# Patient Record
Sex: Male | Born: 1955 | Race: Black or African American | Hispanic: No | Marital: Married | Smoking: Never smoker
Health system: Southern US, Community
[De-identification: ages and names within clinical notes are randomized; demographics above are authoritative.]

## PROBLEM LIST (undated history)

## (undated) DIAGNOSIS — E119 Type 2 diabetes mellitus without complications: Secondary | ICD-10-CM

## (undated) DIAGNOSIS — I10 Essential (primary) hypertension: Secondary | ICD-10-CM

## (undated) DIAGNOSIS — M48061 Spinal stenosis, lumbar region without neurogenic claudication: Secondary | ICD-10-CM

## (undated) DIAGNOSIS — Z889 Allergy status to unspecified drugs, medicaments and biological substances status: Secondary | ICD-10-CM

## (undated) DIAGNOSIS — U071 COVID-19: Secondary | ICD-10-CM

## (undated) HISTORY — PX: COLONOSCOPY W/ POLYPECTOMY: SHX1380

---

## 2010-08-05 ENCOUNTER — Ambulatory Visit (HOSPITAL_COMMUNITY): Admission: RE | Admit: 2010-08-05 | Discharge: 2010-08-05 | Payer: Self-pay | Admitting: Neurosurgery

## 2011-01-03 ENCOUNTER — Emergency Department (HOSPITAL_COMMUNITY)
Admission: EM | Admit: 2011-01-03 | Discharge: 2011-01-03 | Payer: Self-pay | Source: Home / Self Care | Admitting: Emergency Medicine

## 2011-01-03 ENCOUNTER — Inpatient Hospital Stay (HOSPITAL_COMMUNITY)
Admission: AD | Admit: 2011-01-03 | Discharge: 2011-01-06 | Payer: Self-pay | Source: Home / Self Care | Attending: Internal Medicine | Admitting: Internal Medicine

## 2011-01-06 LAB — GLUCOSE, CAPILLARY
Glucose-Capillary: 441 mg/dL — ABNORMAL HIGH (ref 70–99)
Glucose-Capillary: 357 mg/dL — ABNORMAL HIGH (ref 70–99)
Glucose-Capillary: 191 mg/dL — ABNORMAL HIGH (ref 70–99)

## 2011-01-06 LAB — BLOOD GAS, ARTERIAL
Bicarbonate: 21.5 mEq/L (ref 20.0–24.0)
O2 Saturation: 94 %
TCO2: 18.7 mmol/L (ref 0–100)
pH, Arterial: 7.39 (ref 7.350–7.450)

## 2011-01-06 LAB — AMYLASE: Amylase: 46 U/L (ref 0–105)

## 2011-01-06 LAB — HEMOGLOBIN A1C: Hgb A1c MFr Bld: 12.6 % — ABNORMAL HIGH (ref <5.7)

## 2011-01-06 LAB — COMPREHENSIVE METABOLIC PANEL
ALT: 72 U/L — ABNORMAL HIGH (ref 0–53)
AST: 121 U/L — ABNORMAL HIGH (ref 0–37)
Albumin: 3.8 g/dL (ref 3.5–5.2)
BUN: 59 mg/dL — ABNORMAL HIGH (ref 6–23)
Creatinine, Ser: 2.87 mg/dL — ABNORMAL HIGH (ref 0.4–1.5)
GFR calc Af Amer: 28 mL/min — ABNORMAL LOW (ref >60)
Sodium: 126 mEq/L — ABNORMAL LOW (ref 135–145)
Total Protein: 8.2 g/dL (ref 6.0–8.3)

## 2011-01-06 LAB — DIFFERENTIAL
Eosinophils Relative: 2 % (ref 0–5)
Lymphocytes Relative: 26 % (ref 12–46)

## 2011-01-06 LAB — CBC
HCT: 45.2 % (ref 39.0–52.0)
MCV: 82.8 fL (ref 78.0–100.0)
Platelets: 233 10*3/uL (ref 150–400)
RDW: 12.8 % (ref 11.5–15.5)
WBC: 8 10*3/uL (ref 4.0–10.5)

## 2011-01-06 LAB — LIPASE, BLOOD: Lipase: 60 U/L — ABNORMAL HIGH (ref 11–59)

## 2011-01-07 LAB — GLUCOSE, CAPILLARY
Glucose-Capillary: 135 mg/dL — ABNORMAL HIGH (ref 70–99)
Glucose-Capillary: 144 mg/dL — ABNORMAL HIGH (ref 70–99)
Glucose-Capillary: 158 mg/dL — ABNORMAL HIGH (ref 70–99)
Glucose-Capillary: 210 mg/dL — ABNORMAL HIGH (ref 70–99)
Glucose-Capillary: 162 mg/dL — ABNORMAL HIGH (ref 70–99)
Glucose-Capillary: 184 mg/dL — ABNORMAL HIGH (ref 70–99)
Glucose-Capillary: 233 mg/dL — ABNORMAL HIGH (ref 70–99)
Glucose-Capillary: 255 mg/dL — ABNORMAL HIGH (ref 70–99)
Glucose-Capillary: 262 mg/dL — ABNORMAL HIGH (ref 70–99)
Glucose-Capillary: 165 mg/dL — ABNORMAL HIGH (ref 70–99)
Glucose-Capillary: 223 mg/dL — ABNORMAL HIGH (ref 70–99)

## 2011-01-07 LAB — COMPREHENSIVE METABOLIC PANEL
ALT: 75 U/L — ABNORMAL HIGH (ref 0–53)
ALT: 103 U/L — ABNORMAL HIGH (ref 0–53)
AST: 188 U/L — ABNORMAL HIGH (ref 0–37)
Albumin: 3.1 g/dL — ABNORMAL LOW (ref 3.5–5.2)
BUN: 64 mg/dL — ABNORMAL HIGH (ref 6–23)
CO2: 26 mEq/L (ref 19–32)
Calcium: 8.8 mg/dL (ref 8.4–10.5)
Calcium: 9 mg/dL (ref 8.4–10.5)
Chloride: 102 mEq/L (ref 96–112)
Creatinine, Ser: 2.66 mg/dL — ABNORMAL HIGH (ref 0.4–1.5)
Creatinine, Ser: 1.6 mg/dL — ABNORMAL HIGH (ref 0.4–1.5)
GFR calc Af Amer: 55 mL/min — ABNORMAL LOW (ref >60)
GFR calc non Af Amer: 25 mL/min — ABNORMAL LOW (ref >60)
Glucose, Bld: 137 mg/dL — ABNORMAL HIGH (ref 70–99)
Glucose, Bld: 159 mg/dL — ABNORMAL HIGH (ref 70–99)
Potassium: 3.8 mEq/L (ref 3.5–5.1)
Sodium: 144 mEq/L (ref 135–145)
Total Protein: 6.5 g/dL (ref 6.0–8.3)
Total Protein: 6.8 g/dL (ref 6.0–8.3)

## 2011-01-07 LAB — HEPATITIS A ANTIBODY, IGM: Hep A IgM: NEGATIVE

## 2011-01-07 LAB — LIPID PANEL
HDL: 25 mg/dL — ABNORMAL LOW (ref >39)
LDL Cholesterol: 54 mg/dL (ref 0–99)
Triglycerides: 365 mg/dL — ABNORMAL HIGH (ref <150)

## 2011-01-07 LAB — BASIC METABOLIC PANEL
BUN: 20 mg/dL (ref 6–23)
CO2: 26 mEq/L (ref 19–32)
Chloride: 111 mEq/L (ref 96–112)
Glucose, Bld: 176 mg/dL — ABNORMAL HIGH (ref 70–99)
Potassium: 3.4 mEq/L — ABNORMAL LOW (ref 3.5–5.1)
Sodium: 142 mEq/L (ref 135–145)

## 2011-01-07 LAB — HEPATITIS B SURFACE ANTIGEN: Hepatitis B Surface Ag: NEGATIVE

## 2011-01-07 LAB — LIPASE, BLOOD: Lipase: 41 U/L (ref 11–59)

## 2011-01-13 NOTE — H&P (Addendum)
NAMEKENTAVIOUS, Meyer NO.:  [id number]  MEDICAL RECORD NO.:  [id number]          PATIENT TYPE:  INP  LOCATION:  1301                         FACILITY:  [facility]  PHYSICIAN:  Isidor Holts, M.D.  DATE OF BIRTH:  28-Mar-1956  DATE OF ADMISSION:  01/03/2011 DATE OF DISCHARGE:                             HISTORY & PHYSICAL   PRIMARY CARE PROVIDER:  Emeterio Reeve, M.D.  The patient is being admitted to [facility].  CHIEF COMPLAINT:  High glucose.  HISTORY OF PRESENT ILLNESS:  The patient states that he was started on metformin and Glucotrol 2 days ago for a hemoglobin A1c that went from 6 to 12 over the last 3 months.  He indicates that over the last several weeks, he was experiencing increased thirst and polyuria.  When his hemoglobin A1c was found to be 12, he was started on medication.  He indicates that during the last 2 days of taking them med, has noticed a slight improvement with the polyuria and the thirst but it is not completely resolved.  He went to his primary care provider for followup lab work yesterday.  He was called today and asked to come back to the office as the blood work from yesterday yielded a glucose of greater than 700.  He went to his primary care provider's office today, and his glucose was 488 at the office.  The patient denies any headache but does endorse dizziness with position changes.  He denies chest pain, palpitation, nausea, or vomiting.  In addition, he also reports having developed intermittent abdominal pain over the last 3 weeks.  He states the pain is located in the epigastric area and radiates to the left upper quadrant.  He indicates that the pain is a dull aching type of pain and occurs only after eating.  He denies any nausea, vomiting, diarrhea, or constipation.  He does indicate that he has lost 20 pounds over the last several weeks as he has cut back on his eating secondary to the pain.   He also reports that last year he was diagnosed with H. pylori and was treated and it is his belief he completed treatment to the point of resolution, but he does indicate that the pain that he is experiencing now is similar to the pain he experienced during that time. He indicates symptoms came on gradually, have persisted and worsened. Symptoms are characterized as moderate.  Workup in the ED yielded CBG of 539.  We are asked to admit for further evaluation and treatment from his primary care provider.  ALLERGIES:  PREVPAC.  PAST MEDICAL HISTORY: 1. Hypertension. 2. Gout. 3. H. pylori. 4. Elevated triglycerides.  PAST SURGICAL HISTORY:  None.  FAMILY MEDICAL HISTORY:  Father deceased at age 100 from an MI, also had diabetes and hypertension.  Mother deceased at age 60, she had diabetes, hypertension, and renal failure.  The patient has 5 siblings and their collective medical history is positive for diabetes and hypertension. Negative for stroke or heart disease.  The patient is married.  He is a Corporate treasurer.  He denies  tobacco use.  Denies EtOH use.  MEDICATIONS: 1. Glucotrol XL 5 mg p.o. daily. 2. Prilosec 40 mg p.o. daily. 3. Metformin 1000 mg half a tab p.o. b.i.d. 4. Atenolol chlorthalidone 25/100 one tablet p.o. daily. 5. Allopurinol 300 mg p.o. daily. 6. Diovan 320 mg p.o. daily. 7. Zyrtec 10 mg p.o. daily.  REVIEW OF SYSTEMS:  GENERAL:  Negative for fever, chills, or anorexia. Positive for unintentional weight loss. ENT:  Negative for ear pain, nasal congestion, or sore throat. CV:  Negative for chest pain or palpitations.  Positive for trace lower extremity edema. RESPIRATORY:  Negative for increased work of breathing or cough. MUSCULOSKELETAL:  Negative for joint pain or muscle weakness. NEURO:  Positive for some headache.  Negative for visual disturbances, numbness or tingling of extremities. GI:  See HPI. GU:  Positive for polyuria.  Negative for  dysuria or hematuria. PSYCH:  Negative for depression or anxiety. HEME:  Negative for any unusual bruising or bleeding.  LABORATORY DATA:  WBC is 8.0, hemoglobin 16.5, hematocrit 45.2, and platelets 233,000.  Lipase, complete metabolic panel, amylase, and ABG are all pending.  RADIOLOGY:  Chest x-ray is pending.  PHYSICAL EXAMINATION:  VITAL SIGNS:  T 98.2, blood pressure 96/67, heart rate 73, and respiration 18. GENERAL:  Awake, alert, well-nourished, well-hydrated, and no acute distress. HEAD:  Normocephalic, atraumatic.  Pupils equal, round, and reactive to light.  EOMI.  Mucous membranes of his mouth are dry but pink.  No obvious lesion or exudate in his nose or ears. NECK:  Supple.  No JVD.  Full range of motion.  No lymphadenopathy. CV:  Regular rate and rhythm.  No murmur, gallop, or rub.  Trace lower extremity edema.  Pedal pulses present and palpable. RESPIRATORY:  No increased work of breathing.  Breath sounds clear to auscultation bilaterally.  No rhonchi, wheezes, or rales. ABDOMEN:  Obese and soft, very sluggish bowel sounds.  Mild tenderness to palpation in epigastric area.  No mass or organomegaly noted. NEURO:  Alert and oriented x3.  Cranial nerves II-XII grossly intact. MUSCULOSKELETAL:  Moves all extremities.  No joint pain/erythema.  Full range of motion. EXTREMITIES:  Without clubbing or cyanosis.  ASSESSMENT AND PLAN: 1. Severe uncontrolled diabetes type 2.  We will admit to medical     floor.  We will get a complete blood count, comprehensive metabolic     panel, and arterial blood gases.  We will give NovoLog 20 units now     as capillary blood gas in triage was 539.  We will also use sliding     scale glycemic control while awaiting test results.  We will give     intravenous fluids at 250 mL an hour.  If diabetic ketoacidosis, we     will start the glucose stabilizer. 2. Abdominal pain.  We will continue his proton-pump inhibitors.  We     will check  his amylase and his lipase. 3. Hypertension.  Blood pressure is currently 100/70.  We will hold     any antihypertensives for now.  We will monitor closely. 4. Gout.  Currently at baseline.  We will continue his home meds. 5. Gastroesophageal reflux disease, currently at baseline.  Continue     proton-pump inhibitors. 6. Deep venous thrombosis prophylaxis, we will use Lovenox.  CODE STATUS:  The patient is a full code.  This assessment and plan was discussed with Dr. Brien Few.     Gwenyth Bender, NP   ______________________________ Isidor Holts, M.D.  KMB/MEDQ  D:  01/03/2011  T:  01/03/2011  Job:  [id number]  cc:   Emeterio Reeve, MD Fax: [phone]  Electronically Signed by Isidor Holts M.D. on 01/13/2011 04:16:31 PM Electronically Signed by Toya Smothers  on 01/14/2011 12:42:18 PM

## 2011-01-13 NOTE — Discharge Summary (Signed)
NAMEBEAU, RAMSBURG NO.:  [id number]  MEDICAL RECORD NO.:  [id number]          PATIENT TYPE:  INP  LOCATION:  1301                         FACILITY:  [facility]  PHYSICIAN:  Isidor Holts, M.D.  DATE OF BIRTH:  [birth year]  DATE OF ADMISSION:  01/03/2011 DATE OF DISCHARGE:  01/06/2011                              DISCHARGE SUMMARY   PRIMARY MD:  Emeterio Reeve, MD, [facility] at [place].  DISCHARGE DIAGNOSES: 1. Newly-diagnosed uncontrolled diabetes mellitus. 2. Dehydration/acute renal failure, secondary to newly-diagnosed     uncontrolled diabetes mellitus as well as diuretic treatment. 3. Hypertension. 4. Gout. 5. Gastroesophageal reflux disease. 6. Fatty liver, new diagnosis. 7. Hypertriglyceridemia.  DISCHARGE MEDICATIONS: 1. Fenofibrate 84 mg p.o. daily. 2. NovoLog insulin per sliding scale as follows.  For CBG 70-120, no     insulin; CBG 121-150, 3 units; CBG 151-200, 4 units; CBG 201-250, 7     units; CBG 251-300, 11 units; CBG 301-350, 15 units; CBG 351-400 20     units. 3. Lantus insulin 20 units subcutaneously b.i.d. 4. Allopurinol 300 mg p.o. daily. 5. Prilosec 40 mg p.o. daily. 6. Zyrtec 10 mg p.o. daily.  Note:  Atenolol/chlorthalidone and Diovan have been discontinued, secondary to hypotension and dehydration/acute renal failure, until reevaluated by primary MD.  Also, metformin and glipizide are on hold until reevaluated by primary MD.  PROCEDURES: 1. Chest x-ray on January 03, 2011.  This showed shallow inflation.     No evidence of acute cardiopulmonary abnormality. 2. Abdominal ultrasound scan on January 05, 2011.  This showed dense     liver increased echogenicity consistent with hepatic steatosis.  No     acute finding otherwise by abdominal ultrasound.  CONSULTATIONS:  None.  ADMISSION HISTORY:  As in H&P notes of December 24, 2010, dictated by Toya Smothers, PA for this MD.  However in brief, this is a 55 year old male, with  known history of hypertension, gout, previous H pylori infection, hypertriglyceridemia, presenting with increased thirst and polyuria over the past several weeks.  Reportedly, he was started on metformin and Glucotrol 2 days prior to this presentation for hemoglobin A1c that went from 6-12 over the last 3 months.  He subsequently returned to his primary care provider for followup lab work on January 02, 2011 and he then was called back to the office on January 03, 2011, as his blood work report had indicated a glucose of greater than 700 in his primary care provider's office on January 03, 2011, CBG was markedly elevated.  He was therefore referred to the Emergency Department and was subsequently admitted to the hospitalist service for further evaluation, investigation, and management.  CLINICAL COURSE: 1. Severe uncontrolled type 2 diabetes mellitus.  The patient's blood     glucose at the time of initial evaluation, was 539.  He however,     had no evidence of either hyperosmolality or ketoacidosis.  He was     therefore started on aggressive intravenous fluid hydration and     intravenous glucose infusion via glucose stabilizer protocol.     Clinical response was satisfactory, and by January 04, 2011, we     were able to transition him to scheduled Lantus insulin and     carbohydrate modified diet.  Thereafter, titration of Lantus was     carried out, as well as well as sliding-scale insulin coverage.  By     January 06, 2011, his CBGs were ranging between 176 and 223.     Further titration will course be required, which we shall defer to     his primary MD.  2. Dehydration/acute renal failure.  The patient at the time of     presentation, was found to have a BUN which was markedly elevated at     59 with a creatinine of 2.87, consistent with dehydration/acute     renal failure.  He was also hypotensive with SBP ranging between     the 70s and 80s.  He was managed with aggressive  intravenous saline     hydration, with recovery of blood pressure and normalization of     renal function.  Renal ultrasound scan was unremarkable for any     renal abnormalities; and as of January 06, 2011, BUN was 20,     creatinine 1.25, which we suspect is his baseline, although further     improvement may indeed occur.  BP as of January 06, 2011, was also     normal at 122.  3. Hypertension.  As described above, the patient presented with     volume depletion hypotension and acute renal failure.  His     antihypertensive medications were therefore, placed on hold.  This     has not been reinstated; and as of January 06, 2011, his BP was     122/81.  We shall therefore defer decision as to reinstatement     of antihypertensive medications, to his primary MD on followup.  4. History of gout.  This did not prove problematic during the course     of the patient's hospitalization.  He continues on allopurinol.  5. GERD.  He was asymptomatic on proton-pump inhibitor.  6. Fatty liver.  The patient was found to have transaminitis at the     time of his initial evaluation with total bilirubin of 1.1,     alkaline phosphatase 189, AST 121, ALT 72.  The patient is a     nondrinker.  Hepatitis A, B, C serology were all negative.  Liver     ultrasound scan showed diffuse increased echogenicity consistent     with fatty liver disease.  He has been encouraged to lose weight.  7. Hypertriglyceridemia.  The patient's lipid profile showed the     following findings:  Total cholesterol 152, triglyceride 365, HDL     25, LDL 54.  He has been commenced on fenofibrate, accordingly.  DISPOSITION:  The patient was on January 06, 2011, asymptomatic.  There were no new issues.  He was considered clinically stable for discharge, and was therefore discharged accordingly.  ACTIVITY:  As tolerated.  Recommended to increase activity slowly.  DIET:  Heart-healthy/carbohydrate modified.  FOLLOWUP  INSTRUCTIONS:  The patient is to follow up with his primary MD, Dr. Johnn Hai, within a week.  An appointment has already been scheduled for January 15, 2011 on 9:45 a.m.  SPECIAL INSTRUCTIONS:  The patient is recommended to return to regular duties on January 16, 2011.     Isidor Holts, M.D.     CO/MEDQ  D:  01/06/2011  T:  01/06/2011  Job:  [id number]  cc:   Emeterio Reeve, MD Fax: [phone]  Electronically Signed by Isidor Holts M.D. on 01/13/2011 04:16:00 PM

## 2013-03-13 ENCOUNTER — Emergency Department (HOSPITAL_COMMUNITY)
Admission: EM | Admit: 2013-03-13 | Discharge: 2013-03-13 | Disposition: A | Discharge status: Home / Self Care | Payer: BC Managed Care – PPO | Attending: Emergency Medicine | Admitting: Emergency Medicine

## 2013-03-13 ENCOUNTER — Encounter (HOSPITAL_COMMUNITY): Payer: Self-pay

## 2013-03-13 DIAGNOSIS — Z794 Long term (current) use of insulin: Secondary | ICD-10-CM | POA: Insufficient documentation

## 2013-03-13 DIAGNOSIS — M5412 Radiculopathy, cervical region: Secondary | ICD-10-CM | POA: Insufficient documentation

## 2013-03-13 DIAGNOSIS — Z79899 Other long term (current) drug therapy: Secondary | ICD-10-CM | POA: Insufficient documentation

## 2013-03-13 DIAGNOSIS — M25559 Pain in unspecified hip: Secondary | ICD-10-CM | POA: Insufficient documentation

## 2013-03-13 DIAGNOSIS — I10 Essential (primary) hypertension: Secondary | ICD-10-CM | POA: Insufficient documentation

## 2013-03-13 DIAGNOSIS — M5416 Radiculopathy, lumbar region: Secondary | ICD-10-CM

## 2013-03-13 DIAGNOSIS — E119 Type 2 diabetes mellitus without complications: Secondary | ICD-10-CM | POA: Insufficient documentation

## 2013-03-13 DIAGNOSIS — Z7982 Long term (current) use of aspirin: Secondary | ICD-10-CM | POA: Insufficient documentation

## 2013-03-13 DIAGNOSIS — M7918 Myalgia, other site: Secondary | ICD-10-CM

## 2013-03-13 HISTORY — DX: Type 2 diabetes mellitus without complications: E11.9

## 2013-03-13 HISTORY — DX: Essential (primary) hypertension: I10

## 2013-03-13 LAB — POCT I-STAT, CHEM 8
BUN: 20 mg/dL (ref 6–23)
Calcium, Ion: 1.19 mmol/L (ref 1.12–1.23)
Chloride: 102 mEq/L (ref 96–112)
Glucose, Bld: 238 mg/dL — ABNORMAL HIGH (ref 70–99)
HCT: 48 % (ref 39.0–52.0)
TCO2: 26 mmol/L (ref 0–100)

## 2013-03-13 MED ORDER — IBUPROFEN 200 MG PO TABS
600.0000 mg | ORAL_TABLET | Freq: Once | ORAL | Status: AC
  Administered 2013-03-13: 600 mg via ORAL
  Filled 2013-03-13: qty 3

## 2013-03-13 MED ORDER — HYDROCODONE-ACETAMINOPHEN 5-325 MG PO TABS
2.0000 | ORAL_TABLET | Freq: Once | ORAL | Status: AC
  Administered 2013-03-13: 2 via ORAL
  Filled 2013-03-13: qty 2

## 2013-03-13 MED ORDER — CYCLOBENZAPRINE HCL 5 MG PO TABS: 5.0000 mg | ORAL_TABLET | Freq: Three times a day (TID) | ORAL | Status: DC | PRN

## 2013-03-13 MED ORDER — HYDROCODONE-ACETAMINOPHEN 5-325 MG PO TABS: 1.0000 | ORAL_TABLET | Freq: Four times a day (QID) | ORAL | Status: DC | PRN

## 2013-03-13 MED ORDER — CYCLOBENZAPRINE HCL 10 MG PO TABS
5.0000 mg | ORAL_TABLET | Freq: Once | ORAL | Status: AC
  Administered 2013-03-13: 5 mg via ORAL
  Filled 2013-03-13: qty 1

## 2013-03-13 NOTE — ED Notes (Signed)
Dr. Jani Files made aware of 138/101 B/P; pt states he will go home and take his BP medication

## 2013-03-13 NOTE — ED Notes (Signed)
Pt states that he has had severe hip and leg pain to left since yesterday.  Is to see MD on April 7th for same but pain has worsened.  Pt states pain radiated down leg.  No recent trauma or injury known.

## 2013-03-13 NOTE — ED Provider Notes (Signed)
History     CSN: [id number]  Arrival date & time 03/13/13  0625   First MD Initiated Contact with Patient 03/13/13 [phone]      Chief Complaint  Patient presents with  . Hip Pain    (Consider location/radiation/quality/duration/timing/severity/associated sxs/prior treatment) HPI [name] is a 57 y.o. male presenting with left buttocks pain it radiates into the left calf. This is a long-standing problem, he had a MRI of his back in 2011 which showed some stenosis likely responsible for this radicular pain.  He has been referred to neurosurgery with Dr. Mikal Plane by his primary care physician and said "I can't wait because of the pain." Patient said he was given one Vicodin which seemed to do the trick the other day, his pain has been severe it starts in the left buttock and is sharp, radiates he feels it in his buttock and in the calf but not in the posterior thigh.  He's had no associated urinary or fecal incontinence, urinary retention, subtle anesthesia. He's had no weakness. His pain is worse on walking, and in certain positions. Denies any IV drug use, he does not smoke and does not drink alcohol. He has not really taken anything for it besides the Vicodin he got from a friend.   Past Medical History  Diagnosis Date  . Hypertension   . Diabetes mellitus without complication     History reviewed. No pertinent past surgical history.  History reviewed. No pertinent family history.  History  Substance Use Topics  . Smoking status: Never Smoker   . Smokeless tobacco: Not on file  . Alcohol Use: No      Review of Systems At least 10pt or greater review of systems completed and are negative except where specified in the HPI.  Allergies  Other  Home Medications   Current Outpatient Rx  Name  Route  Sig  Dispense  Refill  . allopurinol (ZYLOPRIM) 100 MG tablet   Oral   Take 100 mg by mouth daily.         Marland Kitchen aspirin EC 81 MG tablet   Oral   Take 81 mg by mouth  daily.         Marland Kitchen atenolol-chlorthalidone (TENORETIC) 100-25 MG per tablet   Oral   Take 1 tablet by mouth daily.         . cetirizine (ZYRTEC) 10 MG tablet   Oral   Take 10 mg by mouth daily.         Marland Kitchen CHERRY CONCENTRATE PO   Oral   Take 2 capsules by mouth daily.         . insulin aspart (NOVOLOG) 100 UNIT/ML injection   Subcutaneous   Inject 0-4 Units into the skin 3 (three) times daily before meals. Per sliding scale         . insulin detemir (LEVEMIR) 100 UNIT/ML injection   Subcutaneous   Inject 6 Units into the skin daily.         Marland Kitchen omega-3 acid ethyl esters (LOVAZA) 1 G capsule   Oral   Take 2 g by mouth daily.         . pravastatin (PRAVACHOL) 40 MG tablet   Oral   Take 40 mg by mouth daily.         . cyclobenzaprine (FLEXERIL) 5 MG tablet   Oral   Take 1 tablet (5 mg total) by mouth 3 (three) times daily as needed for muscle spasms.   30  tablet   0   . HYDROcodone-acetaminophen (NORCO/VICODIN) 5-325 MG per tablet   Oral   Take 1-2 tablets by mouth every 6 (six) hours as needed for pain.   17 tablet   0     BP 154/88  Pulse 80  Temp(Src) 98.7 F (37.1 C) (Oral)  Resp 20  SpO2 96%  Physical Exam  Musculoskeletal:       Back:    Nursing notes reviewed.  Electronic medical record reviewed. VITAL SIGNS:   Filed Vitals:   03/13/13 0630  BP: 154/88  Pulse: 80  Temp: 98.7 F (37.1 C)  TempSrc: Oral  Resp: 20  SpO2: 96%   CONSTITUTIONAL: Awake, oriented, appears non-toxic HENT: Atraumatic, normocephalic, oral mucosa pink and moist, airway patent. Nares patent without drainage. External ears normal. EYES: Conjunctiva clear, EOMI, PERRLA NECK: Trachea midline, non-tender, supple CARDIOVASCULAR: Normal heart rate, Normal rhythm, No murmurs, rubs, gallops PULMONARY/CHEST: Clear to auscultation, no rhonchi, wheezes, or rales. Symmetrical breath sounds. Non-tender. ABDOMINAL: Non-distended, soft, non-tender - no rebound or guarding.   BS normal. NEUROLOGIC: Non-focal, moving all four extremities, no gross sensory or motor deficits. BACK: No step-offs or deformities, nontender to palpation in the midline, no skin abnormalities. Left buttock is tender to palpation see pictorial EXTREMITIES: No clubbing, cyanosis, or edema SKIN: Warm, Dry, No erythema, No rash  ED Course  Procedures (including critical care time)  Labs Reviewed  POCT I-STAT, CHEM 8 - Abnormal; Notable for the following:    Glucose, Bld 238 (*)    All other components within normal limits   No results found.   1. Left buttock pain   2. Pain, radicular, lumbar       MDM  [name] is a 57 y.o. male with diabetes presenting with low back pain. On back pain has been chronic since 2011, on review of prior records now I think this is likely acute on chronic back pain. Patient has appropriate followup with neurosurgery no new symptoms consistent with epidural abscess, osteomyelitis, cauda equina syndrome or cord compression syndrome. Check an i-STAT Was found patient creatinine normal at 1.0, we'll put him on 2-3 days of ibuprofen as well as some Norco and Flexeril. Patient to followup with neurosurgery as planned.    I explained the diagnosis and have given explicit precautions to return to the ER including saddle anesthesia, urinary retention, urinary or fecal incontinence or any other new or worsening symptoms. The patient understands and accepts the medical plan as it's been dictated and I have answered their questions. Discharge instructions concerning home care and prescriptions have been given.  The patient is STABLE and is discharged to home in good condition.             Jones Skene, MD 03/13/13 [phone]

## 2013-03-15 ENCOUNTER — Other Ambulatory Visit: Payer: Self-pay | Admitting: Family Medicine

## 2013-03-15 DIAGNOSIS — M5410 Radiculopathy, site unspecified: Secondary | ICD-10-CM

## 2013-03-15 DIAGNOSIS — M545 Low back pain: Secondary | ICD-10-CM

## 2013-03-17 ENCOUNTER — Other Ambulatory Visit: Payer: BC Managed Care – PPO

## 2013-03-25 ENCOUNTER — Encounter (HOSPITAL_COMMUNITY): Payer: Self-pay | Admitting: Pharmacy Technician

## 2013-03-25 NOTE — Progress Notes (Signed)
Need orders in EPIC.  Surgery scheduled for 04/06/13.  Preop appointment will be on 03/28/13 at 0800am.  Thanks.

## 2013-03-25 NOTE — Patient Instructions (Addendum)
[name]  03/25/2013   Your procedure is scheduled on:  04/06/13 [facility]  Report to [facility] at   0530    AM.  Call this number if you have problems the morning of surgery: [phone]       Remember: TAKE ONE HALF DOSAGE INSULIN Tuesday NIGHT    IF YOU NEED TO TAKE IT by previous instructions from Medical MD  Do not eat food  Or drink :After Midnight. Tuesday NIGHT   Take these medicines the morning of surgery with A SIP OF WATER:NEURONTIN, Zyrtec                           MAY TAKE PERCOCET IF NEEDED DO NOT TAKE ANY INSULIN Wednesday MORNING  .  Contacts, dentures or partial plates can not be worn to surgery  Leave suitcase in the car. After surgery it may be brought to your room.  For patients admitted to the hospital, checkout time is 11:00 AM day of  discharge.             SPECIAL INSTRUCTIONS- SEE [facility] PREPARING FOR SURGERY INSTRUCTION SHEET-     DO NOT WEAR JEWELRY, LOTIONS, POWDERS, OR PERFUMES.  WOMEN-- DO NOT SHAVE LEGS OR UNDERARMS FOR 12 HOURS BEFORE SHOWERS. MEN MAY SHAVE FACE.  Patients discharged the day of surgery will not be allowed to drive home. IF going home the day of surgery, you must have a driver and someone to stay with you for the first 24 hours  Name and phone number of your driver:    Angelique Blonder   wife                                                                    Please read over the following fact sheets that you were given: MRSA Information, Incentive Spirometry Sheet, Blood Transfusion Sheet  Information                                                                                   [name]  PST 336  [id number]                 FAILURE TO FOLLOW THESE INSTRUCTIONS MAY RESULT IN  CANCELLATION   OF YOUR SURGERY                                                  Patient Signature _____________________________

## 2013-03-27 ENCOUNTER — Other Ambulatory Visit: Payer: Self-pay | Admitting: Orthopedic Surgery

## 2013-03-28 ENCOUNTER — Other Ambulatory Visit: Payer: Self-pay | Admitting: Orthopedic Surgery

## 2013-03-28 ENCOUNTER — Encounter (HOSPITAL_COMMUNITY): Payer: Self-pay

## 2013-03-28 ENCOUNTER — Encounter (HOSPITAL_COMMUNITY)
Admission: RE | Admit: 2013-03-28 | Discharge: 2013-03-28 | Disposition: A | Discharge status: Home / Self Care | Payer: BC Managed Care – PPO | Source: Ambulatory Visit | Attending: Specialist | Admitting: Specialist

## 2013-03-28 ENCOUNTER — Ambulatory Visit (HOSPITAL_COMMUNITY)
Admission: RE | Admit: 2013-03-28 | Discharge: 2013-03-28 | Disposition: A | Discharge status: Home / Self Care | Payer: BC Managed Care – PPO | Source: Ambulatory Visit | Attending: Orthopedic Surgery | Admitting: Orthopedic Surgery

## 2013-03-28 DIAGNOSIS — Z01810 Encounter for preprocedural cardiovascular examination: Secondary | ICD-10-CM | POA: Insufficient documentation

## 2013-03-28 DIAGNOSIS — E119 Type 2 diabetes mellitus without complications: Secondary | ICD-10-CM | POA: Insufficient documentation

## 2013-03-28 DIAGNOSIS — M48061 Spinal stenosis, lumbar region without neurogenic claudication: Secondary | ICD-10-CM | POA: Insufficient documentation

## 2013-03-28 DIAGNOSIS — Z01812 Encounter for preprocedural laboratory examination: Secondary | ICD-10-CM | POA: Insufficient documentation

## 2013-03-28 DIAGNOSIS — R209 Unspecified disturbances of skin sensation: Secondary | ICD-10-CM | POA: Insufficient documentation

## 2013-03-28 DIAGNOSIS — I10 Essential (primary) hypertension: Secondary | ICD-10-CM | POA: Insufficient documentation

## 2013-03-28 DIAGNOSIS — M5126 Other intervertebral disc displacement, lumbar region: Secondary | ICD-10-CM | POA: Insufficient documentation

## 2013-03-28 DIAGNOSIS — J984 Other disorders of lung: Secondary | ICD-10-CM | POA: Insufficient documentation

## 2013-03-28 HISTORY — DX: Allergy status to unspecified drugs, medicaments and biological substances: Z88.9

## 2013-03-28 HISTORY — DX: Spinal stenosis, lumbar region without neurogenic claudication: M48.061

## 2013-03-28 LAB — BASIC METABOLIC PANEL
BUN: 18 mg/dL (ref 6–23)
Calcium: 9.8 mg/dL (ref 8.4–10.5)
Creatinine, Ser: 1.11 mg/dL (ref 0.50–1.35)
GFR calc Af Amer: 85 mL/min — ABNORMAL LOW (ref >90)

## 2013-03-28 LAB — CBC
MCHC: 35.1 g/dL (ref 30.0–36.0)
MCV: 84.4 fL (ref 78.0–100.0)
Platelets: 256 10*3/uL (ref 150–400)
RDW: 12.2 % (ref 11.5–15.5)
WBC: 5.6 10*3/uL (ref 4.0–10.5)

## 2013-03-28 LAB — SURGICAL PCR SCREEN: Staphylococcus aureus: NEGATIVE

## 2013-03-28 NOTE — Progress Notes (Signed)
Patient denies any cardiac problems other than hyperrtension.  After preliminary EKG completed here, I Called Dr Paulino Rily office for old EKG record

## 2013-03-28 NOTE — Progress Notes (Signed)
03/28/13 1218  OBSTRUCTIVE SLEEP APNEA  Have you ever been diagnosed with sleep apnea through a sleep study? No  Do you snore loudly (loud enough to be heard through closed doors)?  0  Do you often feel tired, fatigued, or sleepy during the daytime? 0  Has anyone observed you stop breathing during your sleep? 0  Do you have, or are you being treated for high blood pressure? 1  BMI more than 35 kg/m2? 1  Age over 55 years old? 1  Neck circumference greater than 40 cm/18 inches? 1  Gender: 1  Obstructive Sleep Apnea Score 5  Score 4 or greater  Results sent to PCP

## 2013-03-30 NOTE — Progress Notes (Signed)
EKG reviewed per Dr Acey Lav with history review- OK

## 2013-04-05 ENCOUNTER — Other Ambulatory Visit: Payer: Self-pay | Admitting: Orthopedic Surgery

## 2013-04-05 NOTE — H&P (Signed)
[name] is an 57 y.o. male.   Chief Complaint: back and left leg pain HPI: 56y/o male c/o back and L buttock/leg pain, numbness, weakness x 1.5 months without injury. The pain radiates to the left buttock, left lateral thigh, left lateral lower leg and left foot. The patient describes the pain as sharp, aching and tingling. Symptoms are exacerbated by standing (and walking). Symptoms are relieved by rest and recumbency. Pain refractory to nonsteroidal anti-inflammatory drugs, non-opioid analgesics, opioid analgesics, muscle relaxants and activity modification. 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 He has started using a walker for ambulation. Denies bowel/bladder incontinence. Does note weakness at the knee. He was sent to 1 visit of PT with no relief with McKenzie methods. Symptoms refractory to conservative tx as listed above. Past Medical History  Diagnosis Date  . Hypertension   . Diabetes mellitus without complication   . Spinal stenosis of lumbar region   . H/O seasonal allergies     Past Surgical History  Procedure Laterality Date  . Colonoscopy w/ polypectomy      No family history on file. Social History:  reports that he has never smoked. He does not have any smokeless tobacco history on file. He reports that he does not drink alcohol or use illicit drugs.  Allergies:  Allergies  Allergen Reactions  . Other Other (See Comments)    Prevpack:made his tongue swell     (Not in a hospital admission)  No results found for this or any previous visit (from the past 48 hour(s)). No results found.  Review of Systems  Constitutional: Negative.   HENT: Negative.   Eyes: Negative.   Respiratory: Negative.   Cardiovascular: Negative.   Gastrointestinal: Negative.   Genitourinary: Negative.   Musculoskeletal: Positive for back pain.  Skin: Negative.   Neurological:  Positive for sensory change and focal weakness.  Endo/Heme/Allergies: Negative.   Psychiatric/Behavioral: Negative.     There were no vitals taken for this visit. Physical Exam  Constitutional: He is oriented to person, place, and time. He appears well-developed and well-nourished. He appears distressed.  HENT:  Head: Normocephalic.  Eyes: Pupils are equal, round, and reactive to light.  Neck: Normal range of motion. Neck supple.  Cardiovascular: Normal rate and regular rhythm.   Respiratory: Effort normal and breath sounds normal.  GI: Soft. Bowel sounds are normal.  Musculoskeletal:  On exam, he's in severe distress. He walks with an antalgic gait, using a walker. SLR produces buttock, thigh and calf pain on the left, negative on the right. He has EHL weakness noted at 4+/5 and altered sensation in the L5 dermatome. Contralateral SLR produces left buttock pain. He has limited extension.  Lumbar spine exam reveals no evidence of soft tissue swelling, ecchymosis or deformity. The abdomen is soft and nontender. Nontender over the trochanters. No cellulitis or lymphadenopathy.  Good range of motion of the lumbar spine without associated pain. Motor is 5/5 including tibialis anterior, plantar flexion, quadriceps and hamstrings. Patient is normoreflexic. There is no Babinski or clonus. The patient has good distal pulses. No DVT. No pain and normal range of motion without instability of the hips, knees and ankles.   Neurological: He is alert and oriented to person, place, and time.  Skin: Skin is warm and dry.  Psychiatric: He has a normal mood and affect.    Three view radiographs of the lumbar spine, AP, lateral,  flexion/extension demonstrate congenitally short pedicles, no instability in flexion or extension. He does have disc degeneration at 4-5 and mild modic changes at 5-1. There is a 9 mm. lesion on the posterior iliac crest bone, and they have recommended patient to return  for a T1 axial sequence to evaluate the presence of fat within the lesion.  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 Disc herniation at 4-5 and stenosis compressing the 5 root. L5 radiculopathy, myotomal weakness, dermatomal dysesthesias despite rest, activity modification and analgesics. 2. Incidental lesion in the iliac crest needing further evaluation.  I had an extensive discussion with [name] concerning pathology, relevant anatomy and treatment options with him and his wife. He's agreed to exclude a therapeutic epidural, given the neurologic deficit, the compression, as well as his underlying insulin-dependent diabetes with a risk of elevated glucose and infection. His A1C recently has not been optimally controlled.He did indicate he just wants it "fixed". He is near 5 weeks without change and using a walker. I'd like to obtain preoperative clearance and schedule this on an urgent basis.  Concerning his medications, I've asked him to increase his Neurontin which is scheduled as well as Percocet and analgesic. We will have him scheduled for that T1 axial image of the lesion in the posterior iliac crest. We will kindly ask Dr. Paulino Rily' help in terms of the preoperative clearance. I appreciate the kind referral.  Plan for microlumbar decompression L4-5. Dr. Shelle Iron has previously discussed the procedure itself as well as risks, complications, and alternatives including but not limited to DVT, PE, infx, bleeding, failure of procedure, need for secondary procedure, anesthesia risk, even death. Pt desires to proceed.  [name] M. for Dr. Shelle Iron 04/05/2013, 2:02 PM

## 2013-04-06 ENCOUNTER — Ambulatory Visit (HOSPITAL_COMMUNITY): Payer: BC Managed Care – PPO

## 2013-04-06 ENCOUNTER — Ambulatory Visit (HOSPITAL_COMMUNITY)
Admission: RE | Admit: 2013-04-06 | Discharge: 2013-04-07 | Disposition: A | Discharge status: Home / Self Care | Payer: BC Managed Care – PPO | Source: Ambulatory Visit | Attending: Specialist | Admitting: Specialist

## 2013-04-06 ENCOUNTER — Encounter (HOSPITAL_COMMUNITY)
Admission: RE | Disposition: A | Discharge status: Home / Self Care | Payer: Self-pay | Source: Ambulatory Visit | Attending: Specialist

## 2013-04-06 ENCOUNTER — Encounter (HOSPITAL_COMMUNITY): Payer: Self-pay | Admitting: Anesthesiology

## 2013-04-06 ENCOUNTER — Ambulatory Visit (HOSPITAL_COMMUNITY): Payer: BC Managed Care – PPO | Admitting: Anesthesiology

## 2013-04-06 ENCOUNTER — Encounter (HOSPITAL_COMMUNITY): Payer: Self-pay | Admitting: *Deleted

## 2013-04-06 DIAGNOSIS — M899 Disorder of bone, unspecified: Secondary | ICD-10-CM | POA: Insufficient documentation

## 2013-04-06 DIAGNOSIS — M48062 Spinal stenosis, lumbar region with neurogenic claudication: Secondary | ICD-10-CM

## 2013-04-06 DIAGNOSIS — M5126 Other intervertebral disc displacement, lumbar region: Secondary | ICD-10-CM | POA: Insufficient documentation

## 2013-04-06 DIAGNOSIS — M949 Disorder of cartilage, unspecified: Secondary | ICD-10-CM | POA: Insufficient documentation

## 2013-04-06 DIAGNOSIS — I10 Essential (primary) hypertension: Secondary | ICD-10-CM | POA: Insufficient documentation

## 2013-04-06 DIAGNOSIS — E109 Type 1 diabetes mellitus without complications: Secondary | ICD-10-CM | POA: Insufficient documentation

## 2013-04-06 DIAGNOSIS — E119 Type 2 diabetes mellitus without complications: Secondary | ICD-10-CM | POA: Insufficient documentation

## 2013-04-06 HISTORY — PX: LUMBAR LAMINECTOMY/DECOMPRESSION MICRODISCECTOMY: SHX5026

## 2013-04-06 LAB — GLUCOSE, CAPILLARY
Glucose-Capillary: 155 mg/dL — ABNORMAL HIGH (ref 70–99)
Glucose-Capillary: 189 mg/dL — ABNORMAL HIGH (ref 70–99)

## 2013-04-06 SURGERY — LUMBAR LAMINECTOMY/DECOMPRESSION MICRODISCECTOMY
Anesthesia: General | Site: Back | Wound class: Clean

## 2013-04-06 MED ORDER — SODIUM CHLORIDE 0.9 % IJ SOLN: 3.0000 mL | INTRAMUSCULAR | Status: DC | PRN

## 2013-04-06 MED ORDER — MEPERIDINE HCL 50 MG/ML IJ SOLN: 6.2500 mg | INTRAMUSCULAR | Status: DC | PRN

## 2013-04-06 MED ORDER — HYDROMORPHONE HCL PF 1 MG/ML IJ SOLN
INTRAMUSCULAR | Status: AC
  Filled 2013-04-06: qty 1

## 2013-04-06 MED ORDER — ACETAMINOPHEN 650 MG RE SUPP: 650.0000 mg | RECTAL | Status: DC | PRN

## 2013-04-06 MED ORDER — CEFAZOLIN SODIUM-DEXTROSE 2-3 GM-% IV SOLR
2.0000 g | INTRAVENOUS | Status: AC
  Administered 2013-04-06: 2 g via INTRAVENOUS

## 2013-04-06 MED ORDER — LIDOCAINE HCL (CARDIAC) 20 MG/ML IV SOLN
INTRAVENOUS | Status: DC | PRN
  Administered 2013-04-06: 100 mg via INTRAVENOUS

## 2013-04-06 MED ORDER — ACETAMINOPHEN 10 MG/ML IV SOLN
INTRAVENOUS | Status: DC | PRN
  Administered 2013-04-06: 1000 mg via INTRAVENOUS

## 2013-04-06 MED ORDER — POTASSIUM CHLORIDE 2 MEQ/ML IV SOLN
INTRAVENOUS | Status: DC
  Administered 2013-04-06: 12:00:00 via INTRAVENOUS
  Filled 2013-04-06 (×2): qty 1000

## 2013-04-06 MED ORDER — ACETAMINOPHEN 10 MG/ML IV SOLN
INTRAVENOUS | Status: AC
  Filled 2013-04-06: qty 100

## 2013-04-06 MED ORDER — CEFAZOLIN SODIUM-DEXTROSE 2-3 GM-% IV SOLR
2.0000 g | Freq: Three times a day (TID) | INTRAVENOUS | Status: AC
  Administered 2013-04-06 (×2): 2 g via INTRAVENOUS
  Filled 2013-04-06 (×2): qty 50

## 2013-04-06 MED ORDER — FENTANYL CITRATE 0.05 MG/ML IJ SOLN
INTRAMUSCULAR | Status: DC | PRN
  Administered 2013-04-06: 50 ug via INTRAVENOUS

## 2013-04-06 MED ORDER — CEFAZOLIN SODIUM-DEXTROSE 2-3 GM-% IV SOLR
INTRAVENOUS | Status: AC
  Filled 2013-04-06: qty 50

## 2013-04-06 MED ORDER — BUPIVACAINE-EPINEPHRINE (PF) 0.5% -1:200000 IJ SOLN
INTRAMUSCULAR | Status: AC
  Filled 2013-04-06: qty 10

## 2013-04-06 MED ORDER — ACETAMINOPHEN 325 MG PO TABS: 650.0000 mg | ORAL_TABLET | ORAL | Status: DC | PRN

## 2013-04-06 MED ORDER — LACTATED RINGERS IV SOLN
INTRAVENOUS | Status: DC
  Administered 2013-04-06 (×2): via INTRAVENOUS

## 2013-04-06 MED ORDER — ONDANSETRON HCL 4 MG/2ML IJ SOLN: 4.0000 mg | INTRAMUSCULAR | Status: DC | PRN

## 2013-04-06 MED ORDER — LORATADINE 10 MG PO TABS
10.0000 mg | ORAL_TABLET | Freq: Every day | ORAL | Status: DC
  Administered 2013-04-07: 10 mg via ORAL
  Filled 2013-04-06 (×2): qty 1

## 2013-04-06 MED ORDER — SODIUM CHLORIDE 0.9 % IJ SOLN: 3.0000 mL | Freq: Two times a day (BID) | INTRAMUSCULAR | Status: DC

## 2013-04-06 MED ORDER — NEOSTIGMINE METHYLSULFATE 1 MG/ML IJ SOLN
INTRAMUSCULAR | Status: DC | PRN
  Administered 2013-04-06: 3 mg via INTRAVENOUS

## 2013-04-06 MED ORDER — PROPOFOL 10 MG/ML IV BOLUS
INTRAVENOUS | Status: DC | PRN
  Administered 2013-04-06: 170 mg via INTRAVENOUS

## 2013-04-06 MED ORDER — METHOCARBAMOL 500 MG PO TABS: 500.0000 mg | ORAL_TABLET | Freq: Four times a day (QID) | ORAL | Status: DC | PRN

## 2013-04-06 MED ORDER — BUPIVACAINE-EPINEPHRINE 0.5% -1:200000 IJ SOLN
INTRAMUSCULAR | Status: DC | PRN
  Administered 2013-04-06: 14 mL

## 2013-04-06 MED ORDER — HYDROMORPHONE HCL PF 1 MG/ML IJ SOLN
0.5000 mg | INTRAMUSCULAR | Status: DC | PRN
  Administered 2013-04-06 (×2): 1 mg via INTRAVENOUS
  Filled 2013-04-06 (×2): qty 1

## 2013-04-06 MED ORDER — ROCURONIUM BROMIDE 100 MG/10ML IV SOLN
INTRAVENOUS | Status: DC | PRN
  Administered 2013-04-06: 30 mg via INTRAVENOUS

## 2013-04-06 MED ORDER — OXYCODONE-ACETAMINOPHEN 7.5-325 MG PO TABS: 1.0000 | ORAL_TABLET | ORAL | Status: DC | PRN

## 2013-04-06 MED ORDER — GABAPENTIN 300 MG PO CAPS
300.0000 mg | ORAL_CAPSULE | Freq: Three times a day (TID) | ORAL | Status: DC
  Administered 2013-04-06 – 2013-04-07 (×3): 300 mg via ORAL
  Filled 2013-04-06 (×5): qty 1

## 2013-04-06 MED ORDER — MENTHOL 3 MG MT LOZG: 1.0000 | LOZENGE | OROMUCOSAL | Status: DC | PRN

## 2013-04-06 MED ORDER — ATENOLOL 100 MG PO TABS
100.0000 mg | ORAL_TABLET | Freq: Once | ORAL | Status: AC
  Administered 2013-04-06: 100 mg via ORAL
  Filled 2013-04-06: qty 1

## 2013-04-06 MED ORDER — SUCCINYLCHOLINE CHLORIDE 20 MG/ML IJ SOLN
INTRAMUSCULAR | Status: DC | PRN
  Administered 2013-04-06: 100 mg via INTRAVENOUS

## 2013-04-06 MED ORDER — METHOCARBAMOL 500 MG PO TABS: 500.0000 mg | ORAL_TABLET | Freq: Three times a day (TID) | ORAL | Status: DC

## 2013-04-06 MED ORDER — INSULIN DETEMIR 100 UNIT/ML ~~LOC~~ SOLN
6.0000 [IU] | Freq: Every day | SUBCUTANEOUS | Status: DC
  Administered 2013-04-07: 6 [IU] via SUBCUTANEOUS
  Filled 2013-04-06: qty 0.06

## 2013-04-06 MED ORDER — PROMETHAZINE HCL 25 MG/ML IJ SOLN: 6.2500 mg | INTRAMUSCULAR | Status: DC | PRN

## 2013-04-06 MED ORDER — INSULIN ASPART 100 UNIT/ML ~~LOC~~ SOLN
0.0000 [IU] | Freq: Three times a day (TID) | SUBCUTANEOUS | Status: DC
  Administered 2013-04-06 – 2013-04-07 (×3): 3 [IU] via SUBCUTANEOUS

## 2013-04-06 MED ORDER — EPHEDRINE SULFATE 50 MG/ML IJ SOLN
INTRAMUSCULAR | Status: DC | PRN
  Administered 2013-04-06: 10 mg via INTRAVENOUS
  Administered 2013-04-06: 5 mg via INTRAVENOUS

## 2013-04-06 MED ORDER — LACTATED RINGERS IV SOLN: INTRAVENOUS | Status: DC

## 2013-04-06 MED ORDER — SODIUM CHLORIDE 0.9 % IV SOLN: 250.0000 mL | INTRAVENOUS | Status: DC

## 2013-04-06 MED ORDER — ALLOPURINOL 300 MG PO TABS
300.0000 mg | ORAL_TABLET | Freq: Every day | ORAL | Status: DC
  Administered 2013-04-07: 300 mg via ORAL
  Filled 2013-04-06 (×2): qty 1

## 2013-04-06 MED ORDER — HYDROMORPHONE HCL PF 1 MG/ML IJ SOLN
0.2500 mg | INTRAMUSCULAR | Status: DC | PRN
  Administered 2013-04-06: 0.5 mg via INTRAVENOUS

## 2013-04-06 MED ORDER — ATENOLOL-CHLORTHALIDONE 100-25 MG PO TABS: 1.0000 | ORAL_TABLET | Freq: Every day | ORAL | Status: DC

## 2013-04-06 MED ORDER — THROMBIN 5000 UNITS EX SOLR
CUTANEOUS | Status: AC
  Filled 2013-04-06: qty 10000

## 2013-04-06 MED ORDER — SODIUM CHLORIDE 0.9 % IR SOLN
Status: DC | PRN
  Administered 2013-04-06: 08:00:00

## 2013-04-06 MED ORDER — HYDROCODONE-ACETAMINOPHEN 5-325 MG PO TABS: 1.0000 | ORAL_TABLET | ORAL | Status: DC | PRN

## 2013-04-06 MED ORDER — GLYCOPYRROLATE 0.2 MG/ML IJ SOLN
INTRAMUSCULAR | Status: DC | PRN
  Administered 2013-04-06: 0.4 mg via INTRAVENOUS

## 2013-04-06 MED ORDER — METHOCARBAMOL 100 MG/ML IJ SOLN: 500.0000 mg | Freq: Four times a day (QID) | INTRAVENOUS | Status: DC | PRN

## 2013-04-06 MED ORDER — ONDANSETRON HCL 4 MG/2ML IJ SOLN
INTRAMUSCULAR | Status: DC | PRN
  Administered 2013-04-06: 4 mg via INTRAVENOUS

## 2013-04-06 MED ORDER — DOCUSATE SODIUM 100 MG PO CAPS
100.0000 mg | ORAL_CAPSULE | Freq: Two times a day (BID) | ORAL | Status: DC
  Administered 2013-04-06 – 2013-04-07 (×3): 100 mg via ORAL
  Filled 2013-04-06 (×4): qty 1

## 2013-04-06 MED ORDER — THROMBIN 5000 UNITS EX SOLR
CUTANEOUS | Status: DC | PRN
  Administered 2013-04-06: 08:00:00 via TOPICAL

## 2013-04-06 MED ORDER — PHENOL 1.4 % MT LIQD: 1.0000 | OROMUCOSAL | Status: DC | PRN

## 2013-04-06 MED ORDER — OXYCODONE-ACETAMINOPHEN 5-325 MG PO TABS
1.0000 | ORAL_TABLET | ORAL | Status: DC | PRN
  Administered 2013-04-06 – 2013-04-07 (×3): 2 via ORAL
  Filled 2013-04-06 (×3): qty 2

## 2013-04-06 MED ORDER — MIDAZOLAM HCL 5 MG/5ML IJ SOLN
INTRAMUSCULAR | Status: DC | PRN
  Administered 2013-04-06: 2 mg via INTRAVENOUS

## 2013-04-06 SURGICAL SUPPLY — 41 items
BAG ZIPLOCK 12X15 (MISCELLANEOUS) ×3 IMPLANT
BENZOIN TINCTURE PRP APPL 2/3 (GAUZE/BANDAGES/DRESSINGS) IMPLANT
CLEANER TIP ELECTROSURG 2X2 (MISCELLANEOUS) ×3 IMPLANT
CLOTH BEACON ORANGE TIMEOUT ST (SAFETY) ×3 IMPLANT
DRAPE MICROSCOPE LEICA (MISCELLANEOUS) ×3 IMPLANT
DRAPE POUCH INSTRU U-SHP 10X18 (DRAPES) ×3 IMPLANT
DRAPE SURG 17X11 SM STRL (DRAPES) ×3 IMPLANT
DRSG AQUACEL AG ADV 3.5X 6 (GAUZE/BANDAGES/DRESSINGS) ×3 IMPLANT
DURAPREP 26ML APPLICATOR (WOUND CARE) ×3 IMPLANT
ELECT BLADE TIP CTD 4 INCH (ELECTRODE) ×3 IMPLANT
ELECT REM PT RETURN 9FT ADLT (ELECTROSURGICAL) ×3
ELECTRODE REM PT RTRN 9FT ADLT (ELECTROSURGICAL) ×2 IMPLANT
GLOVE BIOGEL PI IND STRL 7.5 (GLOVE) ×2 IMPLANT
GLOVE BIOGEL PI INDICATOR 7.5 (GLOVE) ×1
GLOVE SURG SS PI 7.5 STRL IVOR (GLOVE) ×3 IMPLANT
GLOVE SURG SS PI 8.0 STRL IVOR (GLOVE) ×6 IMPLANT
GOWN STRL REIN XL XLG (GOWN DISPOSABLE) ×9 IMPLANT
KIT BASIN OR (CUSTOM PROCEDURE TRAY) ×3 IMPLANT
KIT POSITIONING SURG ANDREWS (MISCELLANEOUS) ×3 IMPLANT
MANIFOLD NEPTUNE II (INSTRUMENTS) ×3 IMPLANT
NEEDLE SPNL 18GX3.5 QUINCKE PK (NEEDLE) ×9 IMPLANT
PATTIES SURGICAL .5 X.5 (GAUZE/BANDAGES/DRESSINGS) IMPLANT
PATTIES SURGICAL .75X.75 (GAUZE/BANDAGES/DRESSINGS) IMPLANT
PATTIES SURGICAL 1X1 (DISPOSABLE) IMPLANT
SPONGE SURGIFOAM ABS GEL 100 (HEMOSTASIS) ×3 IMPLANT
STAPLER VISISTAT (STAPLE) IMPLANT
STRIP CLOSURE SKIN 1/2X4 (GAUZE/BANDAGES/DRESSINGS) ×3 IMPLANT
SUT PROLENE 3 0 PS 2 (SUTURE) IMPLANT
SUT VIC AB 0 CT1 27 (SUTURE)
SUT VIC AB 0 CT1 27XBRD ANTBC (SUTURE) IMPLANT
SUT VIC AB 1 CT1 27 (SUTURE)
SUT VIC AB 1 CT1 27XBRD ANTBC (SUTURE) IMPLANT
SUT VIC AB 1-0 CT2 27 (SUTURE) ×3 IMPLANT
SUT VIC AB 2-0 CT1 27 (SUTURE)
SUT VIC AB 2-0 CT1 TAPERPNT 27 (SUTURE) IMPLANT
SUT VIC AB 2-0 CT2 27 (SUTURE) ×3 IMPLANT
SUT VICRYL 0 27 CT2 27 ABS (SUTURE) IMPLANT
SUT VICRYL 0 UR6 27IN ABS (SUTURE) IMPLANT
SYRINGE 10CC LL (SYRINGE) IMPLANT
TRAY LAMINECTOMY (CUSTOM PROCEDURE TRAY) ×3 IMPLANT
YANKAUER SUCT BULB TIP NO VENT (SUCTIONS) ×3 IMPLANT

## 2013-04-06 NOTE — Anesthesia Preprocedure Evaluation (Addendum)
Anesthesia Evaluation  Patient identified by MRN, date of birth, ID band Patient awake    Reviewed: Allergy & Precautions, H&P , NPO status , Patient's Chart, lab work & pertinent test results  Airway Mallampati: II TM Distance: >3 FB Neck ROM: Full    Dental no notable dental hx. (+) Partial Upper   Pulmonary neg pulmonary ROS,  breath sounds clear to auscultation  Pulmonary exam normal       Cardiovascular hypertension, Pt. on medications Rhythm:Regular Rate:Normal     Neuro/Psych negative neurological ROS  negative psych ROS   GI/Hepatic negative GI ROS, Neg liver ROS,   Endo/Other  diabetes, Type 1, Insulin Dependent  Renal/GU negative Renal ROS  negative genitourinary   Musculoskeletal negative musculoskeletal ROS (+)   Abdominal   Peds negative pediatric ROS (+)  Hematology negative hematology ROS (+)   Anesthesia Other Findings   Reproductive/Obstetrics negative OB ROS                          Anesthesia Physical Anesthesia Plan  ASA: III  Anesthesia Plan: General   Post-op Pain Management:    Induction: Intravenous  Airway Management Planned: Oral ETT  Additional Equipment:   Intra-op Plan:   Post-operative Plan: Extubation in OR  Informed Consent: I have reviewed the patients History and Physical, chart, labs and discussed the procedure including the risks, benefits and alternatives for the proposed anesthesia with the patient or authorized representative who has indicated his/her understanding and acceptance.   Dental advisory given  Plan Discussed with: CRNA  Anesthesia Plan Comments:         Anesthesia Quick Evaluation

## 2013-04-06 NOTE — Interval H&P Note (Signed)
History and Physical Interval Note:  04/06/2013 7:03 AM  [name]  has presented today for surgery, with the diagnosis of STENOSIS L4-5 AND H&P  The various methods of treatment have been discussed with the patient and family. After consideration of risks, benefits and other options for treatment, the patient has consented to  Procedure(s): MICROLUMBAR DECOMPRESSION L4-5 (N/A) as a surgical intervention .  The patient's history has been reviewed, patient examined, no change in status, stable for surgery.  I have reviewed the patient's chart and labs.  Questions were answered to the patient's satisfaction.     [name] C

## 2013-04-06 NOTE — Anesthesia Postprocedure Evaluation (Signed)
  Anesthesia Post-op Note  Patient: [name]  Procedure(s) Performed: Procedure(s) (LRB): LUMBAR LAMINECTOMY/DECOMPRESSION MICRODISCECTOMY L4-L5 (N/A)  Patient Location: PACU  Anesthesia Type: General  Level of Consciousness: awake and alert   Airway and Oxygen Therapy: Patient Spontanous Breathing  Post-op Pain: mild  Post-op Assessment: Post-op Vital signs reviewed, Patient's Cardiovascular Status Stable, Respiratory Function Stable, Patent Airway and No signs of Nausea or vomiting  Last Vitals:  Filed Vitals:   04/06/13 0940  BP:   Pulse: 62  Temp:   Resp: 18    Post-op Vital Signs: stable   Complications: No apparent anesthesia complications

## 2013-04-06 NOTE — H&P (View-Only) (Signed)
[name] is an 56 y.o. male.   Chief Complaint: back and left leg pain HPI: 56y/o male c/o back and L buttock/leg pain, numbness, weakness x 1.5 months without injury. The pain radiates to the left buttock, left lateral thigh, left lateral lower leg and left foot. The patient describes the pain as sharp, aching and tingling. Symptoms are exacerbated by standing (and walking). Symptoms are relieved by rest and recumbency. Pain refractory to nonsteroidal anti-inflammatory drugs, non-opioid analgesics, opioid analgesics, muscle relaxants and activity modification. 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 He has started using a walker for ambulation. Denies bowel/bladder incontinence. Does note weakness at the knee. He was sent to 1 visit of PT with no relief with McKenzie methods. Symptoms refractory to conservative tx as listed above. Past Medical History  Diagnosis Date  . Hypertension   . Diabetes mellitus without complication   . Spinal stenosis of lumbar region   . H/O seasonal allergies     Past Surgical History  Procedure Laterality Date  . Colonoscopy w/ polypectomy      No family history on file. Social History:  reports that he has never smoked. He does not have any smokeless tobacco history on file. He reports that he does not drink alcohol or use illicit drugs.  Allergies:  Allergies  Allergen Reactions  . Other Other (See Comments)    Prevpack:made his tongue swell     (Not in a hospital admission)  No results found for this or any previous visit (from the past 48 hour(s)). No results found.  Review of Systems  Constitutional: Negative.   HENT: Negative.   Eyes: Negative.   Respiratory: Negative.   Cardiovascular: Negative.   Gastrointestinal: Negative.   Genitourinary: Negative.   Musculoskeletal: Positive for back pain.  Skin: Negative.   Neurological:  Positive for sensory change and focal weakness.  Endo/Heme/Allergies: Negative.   Psychiatric/Behavioral: Negative.     There were no vitals taken for this visit. Physical Exam  Constitutional: He is oriented to person, place, and time. He appears well-developed and well-nourished. He appears distressed.  HENT:  Head: Normocephalic.  Eyes: Pupils are equal, round, and reactive to light.  Neck: Normal range of motion. Neck supple.  Cardiovascular: Normal rate and regular rhythm.   Respiratory: Effort normal and breath sounds normal.  GI: Soft. Bowel sounds are normal.  Musculoskeletal:  On exam, he's in severe distress. He walks with an antalgic gait, using a walker. SLR produces buttock, thigh and calf pain on the left, negative on the right. He has EHL weakness noted at 4+/5 and altered sensation in the L5 dermatome. Contralateral SLR produces left buttock pain. He has limited extension.  Lumbar spine exam reveals no evidence of soft tissue swelling, ecchymosis or deformity. The abdomen is soft and nontender. Nontender over the trochanters. No cellulitis or lymphadenopathy.  Good range of motion of the lumbar spine without associated pain. Motor is 5/5 including tibialis anterior, plantar flexion, quadriceps and hamstrings. Patient is normoreflexic. There is no Babinski or clonus. The patient has good distal pulses. No DVT. No pain and normal range of motion without instability of the hips, knees and ankles.   Neurological: He is alert and oriented to person, place, and time.  Skin: Skin is warm and dry.  Psychiatric: He has a normal mood and affect.    Three view radiographs of the lumbar spine, AP, lateral,   flexion/extension demonstrate congenitally short pedicles, no instability in flexion or extension. He does have disc degeneration at 4-5 and mild modic changes at 5-1. There is a 9 mm. lesion on the posterior iliac crest bone, and they have recommended patient to return  for a T1 axial sequence to evaluate the presence of fat within the lesion.  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 Disc herniation at 4-5 and stenosis compressing the 5 root. L5 radiculopathy, myotomal weakness, dermatomal dysesthesias despite rest, activity modification and analgesics. 2. Incidental lesion in the iliac crest needing further evaluation.  I had an extensive discussion with [name] concerning pathology, relevant anatomy and treatment options with him and his wife. He's agreed to exclude a therapeutic epidural, given the neurologic deficit, the compression, as well as his underlying insulin-dependent diabetes with a risk of elevated glucose and infection. His A1C recently has not been optimally controlled.He did indicate he just wants it "fixed". He is near 5 weeks without change and using a walker. I'd like to obtain preoperative clearance and schedule this on an urgent basis.  Concerning his medications, I've asked him to increase his Neurontin which is scheduled as well as Percocet and analgesic. We will have him scheduled for that T1 axial image of the lesion in the posterior iliac crest. We will kindly ask Dr. Wolters' help in terms of the preoperative clearance. I appreciate the kind referral.  Plan for microlumbar decompression L4-5. Dr. Beane has previously discussed the procedure itself as well as risks, complications, and alternatives including but not limited to DVT, PE, infx, bleeding, failure of procedure, need for secondary procedure, anesthesia risk, even death. Pt desires to proceed.  BISSELL, JACLYN M. for Dr. Beane 04/05/2013, 2:02 PM    

## 2013-04-06 NOTE — Brief Op Note (Signed)
04/06/2013  8:55 AM  PATIENT:  [name]  57 y.o. male  PRE-OPERATIVE DIAGNOSIS:  STENOSIS L4-5 AND HNP  POST-OPERATIVE DIAGNOSIS:  STENOSIS L4-5 AND HNP  PROCEDURE:  Procedure(s): LUMBAR LAMINECTOMY/DECOMPRESSION MICRODISCECTOMY L4-L5 (N/A)  SURGEON:  Surgeon(s) and Role:    * Javier Docker, MD - Primary  PHYSICIAN ASSISTANT:   ASSISTANTS: Bissell   ANESTHESIA:   general  EBL:  Total I/O In: 1000 [I.V.:1000] Out: -   BLOOD ADMINISTERED:none  DRAINS: none   LOCAL MEDICATIONS USED:  MARCAINE     SPECIMEN:  No Specimen L45 disc  DISPOSITION OF SPECIMEN:  PATHOLOGY  COUNTS:  YES  TOURNIQUET:  * No tourniquets in log *  DICTATION: .Other Dictation: Dictation Number [id number]  PLAN OF CARE: Admit for overnight observation  PATIENT DISPOSITION:  PACU - hemodynamically stable.   Delay start of Pharmacological VTE agent (>24hrs) due to surgical blood loss or risk of bleeding: yes

## 2013-04-06 NOTE — Op Note (Signed)
[name], [name] NO.:  [id number]  MEDICAL RECORD NO.:  [id number]  LOCATION:  1536                         FACILITY:  [facility]  PHYSICIAN:  Jene Every, M.D.    DATE OF BIRTH:  07-02-1956  DATE OF PROCEDURE:  04/06/2013 DATE OF DISCHARGE:                              OPERATIVE REPORT   PREOPERATIVE DIAGNOSIS:  Spinal stenosis, herniated nucleus pulposus at L4-5, left.  POSTOPERATIVE DIAGNOSIS:  Spinal stenosis, herniated nucleus pulposus at L4-5, left.  PROCEDURE PERFORMED: 1. Microlumbar decompression at L4-5. 2. Foraminotomies at L4 and L5. 3. Microdiskectomy L4-5.  Operative case difficulty increased due the patient's elevated BMI, 115 kg and 37 BMI.  ANESTHESIA:  General.  ASSISTANT:  Rodney Poche, PA, utilized for intermittent gentle traction, suction, and patient positioning.  HISTORY:  This is a 57 year old with left lower extremity radicular pain, L5 nerve root distribution, mild terminal weakness, dermatomal dysesthesia secondary to multifactorial compression of L5 root, congenital stenosis and disk herniation, failing conservative treatment, is indicated for decompression.  Risk and benefits discussed including bleeding, infection, damage to vascular structure, DVT, PE, anesthetic complications, etc., and need for fusion in future.  TECHNIQUE:  With the patient in supine position, after induction of adequate anesthesia and 2 g Kefzol, placed prone on the [name] frame, all bony prominences were well padded.  Lumbar region was prepped and draped in usual sterile fashion.  Two 18-gauge spinal needle was utilized to localize L4-5 interspace, confirmed with x-ray.  Incision was made from spinous process L4-5.  Subcutaneous tissue was dissected. Electrocautery was utilized to achieve hemostasis.  Marcaine 0.25% with epinephrine was infiltrated in the paraspinous musculature.  Dorsolumbar fascia identified, divided in line with skin  incision.  Paraspinous muscle elevated from lamina 4 and 5.  [name] retractors were placed. Confirmatory radiograph obtained.  Operating microscope was draped and brought to the surgical field.  Hemilaminotomy of the caudad edge of L4 was performed with 2 and 3 mm Kerrison and utilizing a micro osteotome preserving the pars.  There was facet hypertrophy noted and no interlaminar window.  I used a straight curette to detach ligamentum flavum from the cephalad edge of 5, and then protecting the elements, I used a 2 mm Kerrison to perform a 5 foraminotomy.  Hypertrophic facet was noted here as well.  The 5 root was gently protected and decompression of lateral recess to the medial border of pedicle with 2 mm Kerrison was performed foraminotomy of L4 due to stenosis as well. Focal HNP was noted.  With the neural elements well protected, I performed an annulotomy.  Copious portion of disk material was removed from the disk space with a micro pituitary and a straight pituitary further mobilizing with a nerve hook and then staying preserving the endplates.  Full diskectomy of herniated material was performed. Multiple fragments were removed consistent with that seen on the MRI. Hockey-stick probe passed freely up the foramen of 4 and 5.  We checked beneath thecal sac, the exit of the root, the shoulder of the root of both foramen.  No further neural compression noted.  With 1 cm of excursion in the 5 root medial pedicle, tension was noted  as well. Confirmatory radiograph obtained at L4-5.  Copiously irrigated disk space and removed [name] retractor.  No evidence of CSF leakage or active bleeding.  Removed the [facility] retractor.  Irrigated the paraspinous musculature.  No active bleeding.  We repaired the fascia with 1 Vicryl subcu with multiple layers of 2-0 Vicryl.  The skin was re- prepped.  The skin was reapproximated with 4-0 subcuticular Prolene. The patient had fairly elevated  BMI, 115 kg requiring the deep retractors and multiple layers of closure of the subcutaneous tissue. He was placed supine on hospital bed, extubated without difficulty, and transported to the recovery in satisfactory condition.  The patient tolerated the procedure well.  There were no complications.     Jene Every, M.D.     [name]  D:  04/06/2013  T:  04/06/2013  Job:  [id number]

## 2013-04-06 NOTE — Transfer of Care (Signed)
Immediate Anesthesia Transfer of Care Note  Patient: [name]  Procedure(s) Performed: Procedure(s): LUMBAR LAMINECTOMY/DECOMPRESSION MICRODISCECTOMY L4-L5 (N/A)  Patient Location: PACU  Anesthesia Type:General  Level of Consciousness: sedated  Airway & Oxygen Therapy: Patient Spontanous Breathing and Patient connected to face mask oxygen  Post-op Assessment: Report given to PACU RN and Post -op Vital signs reviewed and stable  Post vital signs: Reviewed and stable  Complications: No apparent anesthesia complications

## 2013-04-07 ENCOUNTER — Encounter (HOSPITAL_COMMUNITY): Payer: Self-pay | Admitting: Specialist

## 2013-04-07 LAB — BASIC METABOLIC PANEL
CO2: 28 mEq/L (ref 19–32)
Chloride: 99 mEq/L (ref 96–112)
GFR calc non Af Amer: 74 mL/min — ABNORMAL LOW (ref >90)
Glucose, Bld: 178 mg/dL — ABNORMAL HIGH (ref 70–99)
Potassium: 3.9 mEq/L (ref 3.5–5.1)
Sodium: 136 mEq/L (ref 135–145)

## 2013-04-07 LAB — GLUCOSE, CAPILLARY: Glucose-Capillary: 180 mg/dL — ABNORMAL HIGH (ref 70–99)

## 2013-04-07 MED ORDER — ATENOLOL 100 MG PO TABS
100.0000 mg | ORAL_TABLET | Freq: Every day | ORAL | Status: DC
  Administered 2013-04-07: 100 mg via ORAL
  Filled 2013-04-07: qty 1

## 2013-04-07 MED ORDER — ASPIRIN EC 81 MG PO TBEC: 81.0000 mg | DELAYED_RELEASE_TABLET | Freq: Every day | ORAL | Status: DC

## 2013-04-07 MED ORDER — CHLORTHALIDONE 25 MG PO TABS
25.0000 mg | ORAL_TABLET | Freq: Every day | ORAL | Status: DC
  Administered 2013-04-07: 25 mg via ORAL
  Filled 2013-04-07: qty 1

## 2013-04-07 NOTE — Progress Notes (Signed)
Subjective: 1 Day Post-Op Procedure(s) (LRB): LUMBAR LAMINECTOMY/DECOMPRESSION MICRODISCECTOMY L4-L5 (N/A) Patient reports pain as 2 on 0-10 scale.    Objective: Vital signs in last 24 hours: Temp:  [97.7 F (36.5 C)-99 F (37.2 C)] 99 F (37.2 C) (04/24 0541) Pulse Rate:  [60-74] 74 (04/24 0541) Resp:  [16-24] 18 (04/24 0541) BP: (104-146)/(68-87) 104/68 mmHg (04/24 0541) SpO2:  [92 %-100 %] 94 % (04/24 0541) Weight:  [115.214 kg (254 lb)] 115.214 kg (254 lb) (04/23 1130)  Intake/Output from previous day: 04/23 0701 - 04/24 0700 In: 2851.7 [P.O.:660; I.V.:2191.7] Out: 1900 [Urine:1900] Intake/Output this shift:    No results found for this basename: HGB,  in the last 72 hours No results found for this basename: WBC, RBC, HCT, PLT,  in the last 72 hours  Recent Labs  04/07/13 0425  NA 136  K 3.9  CL 99  CO2 28  BUN 12  CREATININE 1.10  GLUCOSE 178*  CALCIUM 9.1   No results found for this basename: LABPT, INR,  in the last 72 hours  Neurologically intact Sensation intact distally Incision: dressing C/D/I  Assessment/Plan: 1 Day Post-Op Procedure(s) (LRB): LUMBAR LAMINECTOMY/DECOMPRESSION MICRODISCECTOMY L4-L5 (N/A) Advance diet Up with therapy D/C IV fluids Discharge home with home health no home health. Monitor glucose levels. D/C instructions given.  [name] C 04/07/2013, 7:45 AM

## 2013-04-07 NOTE — Evaluation (Signed)
Physical Therapy One Time Evaluation Patient Details Name: [name] MRN: [id number] DOB: Dec 10, 1956 Today's Date: 04/07/2013 Time: 4098-1191 PT Time Calculation (min): 15 min  PT Assessment / Plan / Recommendation Clinical Impression  Pt is a 57 year old male s/p L4-5 laminectomy/decompression.  Pt educated on back precautions and log roll technique and provided back precautions handout.  Pt did well with mobility, ambulating without assistive device and performing stairs in preparation for d/c home today with spouse.  Pt had no further questions/concerns regarding PT.      PT Assessment  Patent does not need any further PT services    Follow Up Recommendations  No PT follow up    Does the patient have the potential to tolerate intense rehabilitation      Barriers to Discharge        Equipment Recommendations  None recommended by PT    Recommendations for Other Services     Frequency      Precautions / Restrictions Precautions Precautions: Back Precaution Comments: pt educated on back precautions and provided handout   Pertinent Vitals/Pain Pt reports only soreness, no pain.     Mobility  Bed Mobility Bed Mobility: Rolling Right;Right Sidelying to Sit Rolling Right: 5: Supervision Right Sidelying to Sit: 5: Supervision;HOB flat Details for Bed Mobility Assistance: verbal cues for log roll technique, no assist required Transfers Transfers: Sit to Stand;Stand to Sit Sit to Stand: 5: Supervision;From bed Stand to Sit: 5: Supervision;To chair/3-in-1 Details for Transfer Assistance: pt educated to maintain straight spine for transfers and can increase BOS and perform  mini squat to descend if necessary Ambulation/Gait Ambulation/Gait Assistance: 5: Supervision Ambulation Distance (Feet): 450 Feet Assistive device: None Gait Pattern: Decreased trunk rotation;Step-through pattern;Antalgic General Gait Details: slight antalgic gait observed after stairs however  improved quickly Stairs: Yes Stairs Assistance: 5: Supervision Stairs Assistance Details (indicate cue type and reason): verbal cues for safe technique including sequence, pt able to perform alternating ascending but required step to descending Stair Management Technique: One rail Left;Step to pattern;Forwards Number of Stairs: 10    Exercises     PT Diagnosis:    PT Problem List:   PT Treatment Interventions:     PT Goals    Visit Information  Last PT Received On: 04/07/13 Assistance Needed: +1    Subjective Data  Subjective: I walked last night but I had the IV pole.   Prior Functioning  Home Living Lives With: Spouse Home Layout: Two level Alternate Level Stairs-Number of Steps: 13 Alternate Level Stairs-Rails: Left Home Adaptive Equipment: None Prior Function Level of Independence: Independent Communication Communication: No difficulties    Cognition  Cognition Arousal/Alertness: Awake/alert Behavior During Therapy: WFL for tasks assessed/performed Overall Cognitive Status: Within Functional Limits for tasks assessed    Extremity/Trunk Assessment Right Lower Extremity Assessment RLE ROM/Strength/Tone: Within functional levels RLE Sensation: WFL - Light Touch Left Lower Extremity Assessment LLE ROM/Strength/Tone: Within functional levels (except DF 4/5) LLE Sensation: Deficits LLE Sensation Deficits: pt reports some numbness present in L great toe, otherwise presurgical symptoms have improved, pt denies numbness and tingling bilaterally Trunk Assessment Trunk Assessment: Normal   Balance    End of Session PT - End of Session Activity Tolerance: Patient tolerated treatment well Patient left: in chair;with call bell/phone within reach  GP Functional Assessment Tool Used: clinical judgement Functional Limitation: Mobility: Walking and moving around Mobility: Walking and Moving Around Current Status (Y7829): At least 1 percent but less than 20 percent impaired,  limited or restricted Mobility: Walking and Moving Around Goal Status [phone]): At least 1 percent but less than 20 percent impaired, limited or restricted Mobility: Walking and Moving Around Discharge Status [phone]): At least 1 percent but less than 20 percent impaired, limited or restricted   Sharis Keeran,KATHrine E 04/07/2013, 9:26 AM Zenovia Jarred, PT, DPT 04/07/2013 Pager: [phone]

## 2013-04-07 NOTE — Discharge Summary (Signed)
Physician Discharge Summary   Patient ID: [name] MRN: [id number] DOB/AGE: 55-Mar-1957 55 y.o.  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 Hypertension   . Diabetes mellitus without complication   . Spinal stenosis of lumbar region   . H/O seasonal allergies    Discharge Diagnoses:   Principal Problem:   Spinal stenosis, lumbar region, with neurogenic claudication  Procedure:  Procedure(s) (LRB): LUMBAR LAMINECTOMY/DECOMPRESSION MICRODISCECTOMY L4-L5 (N/A)   Consults: None  HPI:  see H&P    Laboratory Data: Hospital Outpatient Visit on 03/28/2013  Component Date Value Range Status  . Sodium 03/28/2013 137  135 - 145 mEq/L Final  . Potassium 03/28/2013 4.8  3.5 - 5.1 mEq/L Final   Comment: MODERATE HEMOLYSIS                          HEMOLYSIS AT THIS LEVEL MAY AFFECT RESULT  . Chloride 03/28/2013 99  96 - 112 mEq/L Final  . CO2 03/28/2013 29  19 - 32 mEq/L Final  . Glucose, Bld 03/28/2013 227* 70 - 99 mg/dL Final  . BUN 78/29/5621 18  6 - 23 mg/dL Final  . Creatinine, Ser 03/28/2013 1.11  0.50 - 1.35 mg/dL Final  . Calcium 30/86/5784 9.8  8.4 - 10.5 mg/dL Final  . GFR calc non Af Amer 03/28/2013 74* >90 mL/min Final  . GFR calc Af Amer 03/28/2013 85* >90 mL/min Final   Comment:                                 The eGFR has been calculated                          using the CKD EPI equation.                          This calculation has not been                          validated in all clinical                          situations.                          eGFR's persistently                          <90 mL/min signify                          possible Chronic Kidney Disease.  . WBC 03/28/2013 5.6  4.0 - 10.5 K/uL Final  . RBC 03/28/2013 5.20  4.22 - 5.81 MIL/uL Final  . Hemoglobin 03/28/2013 15.4  13.0 - 17.0 g/dL Final  . HCT 69/62/9528 43.9   39.0 - 52.0 % Final  . MCV 03/28/2013 84.4  78.0 - 100.0 fL Final  . MCH 03/28/2013 29.6  26.0 - 34.0 pg Final  . MCHC 03/28/2013 35.1  30.0 - 36.0 g/dL Final  . RDW 41/32/4401 12.2  11.5 - 15.5 % Final  . Platelets 03/28/2013  256  150 - 400 K/uL Final  . MRSA, PCR 03/28/2013 NEGATIVE  NEGATIVE Final  . Staphylococcus aureus 03/28/2013 NEGATIVE  NEGATIVE Final   Comment:                                 The Xpert SA Assay (FDA                          approved for NASAL specimens                          in patients over 50 years of age),                          is one component of                          a comprehensive surveillance                          program.  Test performance has                          been validated by [employer] for patients greater                          than or equal to 68 year old.                          It is not intended                          to diagnose infection nor to                          guide or monitor treatment.   No results found for this basename: HGB,  in the last 72 hours No results found for this basename: WBC, RBC, HCT, PLT,  in the last 72 hours  Recent Labs  04/07/13 0425  NA 136  K 3.9  CL 99  CO2 28  BUN 12  CREATININE 1.10  GLUCOSE 178*  CALCIUM 9.1   No results found for this basename: LABPT, INR,  in the last 72 hours  X-Rays:Dg Chest 2 View  03/28/2013  *RADIOLOGY REPORT*  Clinical Data: Preoperative evaluation for microdiskectomy, history of hypertension  CHEST - 2 VIEW  Comparison: 01/03/2011  Findings: The heart and pulmonary vascularity are stable.  The lungs are clear bilaterally.  Mild scarring is noted in the left base stable from prior study.  No bony abnormality is seen.  IMPRESSION: No acute abnormality noted.   Original Report Authenticated By: Alcide Clever, M.D.    Dg Lumbar Spine 2-3 Views  03/28/2013  *RADIOLOGY REPORT*  Clinical Data: Preoperative evaluation for  lumbar decompression, low back pain  LUMBAR SPINE - 2-3 VIEW  Comparison: None.  Findings: Five lumbar type vertebral bodies are well visualized. Vertebral body height is well-maintained.  Mild osteophytic changes are seen.  Mild disc space narrowing is noted at L4-5.  IMPRESSION: Mild degenerative change as described.   Original Report Authenticated By: Alcide Clever, M.D.    Dg Spine Portable 1 View  04/06/2013  *RADIOLOGY REPORT*  Clinical Data: Intraoperative localization film.  PORTABLE SPINE - 1 VIEW  Comparison: Localization film 04/06/2013 at 1750 hours.  Findings: Lateral intraoperative view of the lumbar spine demonstrates localization of L4-5.  IMPRESSION: L4-5 localization.   Original Report Authenticated By: Holley Dexter, M.D.    Dg Spine Portable 1 View  04/06/2013  *RADIOLOGY REPORT*  Clinical Data: Intraoperative localization.  PORTABLE SPINE - 1 VIEW  Comparison: Earlier today and multiple priors.  Findings: Intraoperative localization is performed.  Using the numbering convention on prior examinations, there is soft tissue retractors dorsal to the L4-L5 interspace.  A probe overlies the interspinous space of L4-L5.  Single lateral view submitted for interpretation.  IMPRESSION: Intraoperative localization at L4-L5.   Original Report Authenticated By: Andreas Newport, M.D.    Dg Spine Portable 1 View  04/06/2013  *RADIOLOGY REPORT*  Clinical Data: Intraoperative localization.  PORTABLE SPINE - 1 VIEW  Comparison: 03/28/2013.  MRI lumbar spine 08/05/2010.  Findings: Prior radiographs demonstrated five lumbar type vertebral bodies.  This correlates with the MRI 08/05/2010. Lower lumbar spondylosis.  Posterior localization needles are present just superior and inferior to the L4 spinous process.  IMPRESSION: Intraoperative localization as above.   Original Report Authenticated By: Andreas Newport, M.D.     EKG: Orders placed during the hospital encounter of 03/28/13  . EKG 12-LEAD  . EKG  12-LEAD     Hospital Course: Patient was admitted to [facility] and taken to the OR and underwent the above state procedure without complications.  Patient tolerated the procedure well and was later transferred to the recovery room and then to the orthopaedic floor for postoperative care.  They were given PO and IV analgesics for pain control following their surgery.  They were given 24 hours of postoperative antibiotics.   PT was consulted postop to assist with mobility and transfers.  The patient was allowed to be WBAT with therapy and was taught back precautions. Discharge planning was consulted to help with postop disposition and equipment needs.  Patient had a good night on the evening of surgery and started to get up OOB with therapy on day one. Patient was seen in rounds and was ready to go home on day one.  They were given discharge instructions and dressing directions.  They were instructed on when to follow up in the office with Dr. Shelle Iron.  Discharge Medications: Prior to Admission medications   Medication Sig Start Date End Date Taking? Authorizing Provider  allopurinol (ZYLOPRIM) 100 MG tablet Take 300 mg by mouth daily before breakfast.    Yes Historical Provider, MD  atenolol-chlorthalidone (TENORETIC) 100-25 MG per tablet Take 1 tablet by mouth daily before breakfast.    Yes Historical Provider, MD  cetirizine (ZYRTEC) 10 MG tablet Take 10 mg by mouth daily.   Yes Historical Provider, MD  CHERRY CONCENTRATE PO Take 2 capsules by mouth daily. States will stop 03/29/13   Yes Historical Provider, MD  gabapentin (NEURONTIN) 300 MG capsule Take 300 mg by mouth 3 (three) times daily.   Yes Historical Provider, MD  insulin aspart (NOVOLOG) 100 UNIT/ML injection Inject 0-4 Units into the skin daily as needed for high blood sugar (only if sugar is over 200). Per sliding scale over 200 uses 4 units  Yes Historical Provider, MD  insulin detemir (LEVEMIR) 100 UNIT/ML injection Inject 6  Units into the skin daily with breakfast.    Yes Historical Provider, MD  omega-3 acid ethyl esters (LOVAZA) 1 G capsule Take 1 g by mouth daily. States will stop 03/29/13   Yes Historical Provider, MD  pravastatin (PRAVACHOL) 40 MG tablet Take 20 mg by mouth daily before breakfast.    Yes Historical Provider, MD  aspirin EC 81 MG tablet Take 1 tablet (81 mg total) by mouth daily. States last dose will be 03/29/13 04/07/13   Dayna Barker. Bissell, PA-C  meloxicam (MOBIC) 15 MG tablet Take 15 mg by mouth daily.    Historical Provider, MD  methocarbamol (ROBAXIN) 500 MG tablet Take 1 tablet (500 mg total) by mouth 3 (three) times daily. 04/06/13   Javier Docker, MD  oxyCODONE-acetaminophen (PERCOCET) 7.5-325 MG per tablet Take 1-2 tablets by mouth every 4 (four) hours as needed for pain. 04/06/13   Javier Docker, MD    Diet: Diabetic diet Activity:WBAT Follow-up:in 10-14 days Disposition - Home Discharged Condition: good   Discharge Orders   Future Orders Complete By Expires     Call MD / Call 911  As directed     Comments:      If you experience chest pain or shortness of breath, CALL 911 and be transported to the hospital emergency room.  If you develope a fever above 101 F, pus (white drainage) or increased drainage or redness at the wound, or calf pain, call your surgeon's office.    Constipation Prevention  As directed     Comments:      Drink plenty of fluids.  Prune juice may be helpful.  You may use a stool softener, such as Colace (over the counter) 100 mg twice a day.  Use MiraLax (over the counter) for constipation as needed.    Diet - low sodium heart healthy  As directed     Increase activity slowly as tolerated  As directed         Medication List    STOP taking these medications       oxyCODONE-acetaminophen 5-325 MG per tablet  Commonly known as:  PERCOCET/ROXICET      TAKE these medications       allopurinol 100 MG tablet  Commonly known as:  ZYLOPRIM  Take 300 mg  by mouth daily before breakfast.     aspirin EC 81 MG tablet  Take 1 tablet (81 mg total) by mouth daily. States last dose will be 03/29/13     atenolol-chlorthalidone 100-25 MG per tablet  Commonly known as:  TENORETIC  Take 1 tablet by mouth daily before breakfast.     cetirizine 10 MG tablet  Commonly known as:  ZYRTEC  Take 10 mg by mouth daily.     CHERRY CONCENTRATE PO  Take 2 capsules by mouth daily. States will stop 03/29/13     gabapentin 300 MG capsule  Commonly known as:  NEURONTIN  Take 300 mg by mouth 3 (three) times daily.     insulin aspart 100 UNIT/ML injection  Commonly known as:  novoLOG  Inject 0-4 Units into the skin daily as needed for high blood sugar (only if sugar is over 200). Per sliding scale over 200 uses 4 units     insulin detemir 100 UNIT/ML injection  Commonly known as:  LEVEMIR  Inject 6 Units into the skin daily with breakfast.     meloxicam  15 MG tablet  Commonly known as:  MOBIC  Take 15 mg by mouth daily.     methocarbamol 500 MG tablet  Commonly known as:  ROBAXIN  Take 1 tablet (500 mg total) by mouth 3 (three) times daily.     omega-3 acid ethyl esters 1 G capsule  Commonly known as:  LOVAZA  Take 1 g by mouth daily. States will stop 03/29/13     oxyCODONE-acetaminophen 7.5-325 MG per tablet  Commonly known as:  PERCOCET  Take 1-2 tablets by mouth every 4 (four) hours as needed for pain.     pravastatin 40 MG tablet  Commonly known as:  PRAVACHOL  Take 20 mg by mouth daily before breakfast.           Follow-up Information   Follow up with BEANE,JEFFREY C, MD In 2 weeks.   Contact information:   [address] [place] [address] [place] [address] [phone]       Signed: Dorothy Spark. 04/07/2013, 2:04 PM

## 2016-11-24 ENCOUNTER — Other Ambulatory Visit: Payer: Self-pay | Admitting: Family Medicine

## 2018-06-07 ENCOUNTER — Other Ambulatory Visit: Payer: Self-pay | Admitting: Gastroenterology

## 2018-06-07 DIAGNOSIS — R1012 Left upper quadrant pain: Secondary | ICD-10-CM

## 2018-06-09 ENCOUNTER — Ambulatory Visit
Admission: RE | Admit: 2018-06-09 | Discharge: 2018-06-09 | Disposition: A | Discharge status: Home / Self Care | Payer: BC Managed Care – PPO | Source: Ambulatory Visit | Attending: Family Medicine | Admitting: Family Medicine

## 2018-06-09 ENCOUNTER — Other Ambulatory Visit: Payer: Self-pay | Admitting: Family Medicine

## 2018-06-09 DIAGNOSIS — R109 Unspecified abdominal pain: Secondary | ICD-10-CM

## 2018-06-09 MED ORDER — IOPAMIDOL (ISOVUE-300) INJECTION 61%
125.0000 mL | Freq: Once | INTRAVENOUS | Status: AC | PRN
  Administered 2018-06-09: 100 mL via INTRAVENOUS

## 2018-06-16 ENCOUNTER — Other Ambulatory Visit: Payer: BC Managed Care – PPO

## 2018-10-29 ENCOUNTER — Other Ambulatory Visit: Payer: Self-pay | Admitting: Family Medicine

## 2018-10-29 DIAGNOSIS — K7581 Nonalcoholic steatohepatitis (NASH): Secondary | ICD-10-CM

## 2018-11-05 ENCOUNTER — Ambulatory Visit
Admission: RE | Admit: 2018-11-05 | Discharge: 2018-11-05 | Disposition: A | Discharge status: Home / Self Care | Payer: BC Managed Care – PPO | Source: Ambulatory Visit | Attending: Family Medicine | Admitting: Family Medicine

## 2018-11-05 DIAGNOSIS — K7581 Nonalcoholic steatohepatitis (NASH): Secondary | ICD-10-CM

## 2019-11-19 IMAGING — CT CT ABD-PELV W/ CM
1 of 3 series · 13 of 32 positions shown, 19 images · IV contrast (APPLIED)
Comparison: None.

CLINICAL DATA: Abdominal pain over the last 10 days. Constipation.
Weight loss.

EXAM:
CT ABDOMEN AND PELVIS WITH CONTRAST
TECHNIQUE: Multidetector CT imaging of the abdomen and pelvis was performed
using the standard protocol following bolus administration of
intravenous contrast.
CONTRAST:  100mL [id number] IOPAMIDOL ([id number]) INJECTION 61%

[Series 2: abd/pelvis w/cm · axial · 0.95mm/px · z∈[-455,+5]mm · 13 of 108 slices shown, 19 images]
[im 8/108  soft-tissue]
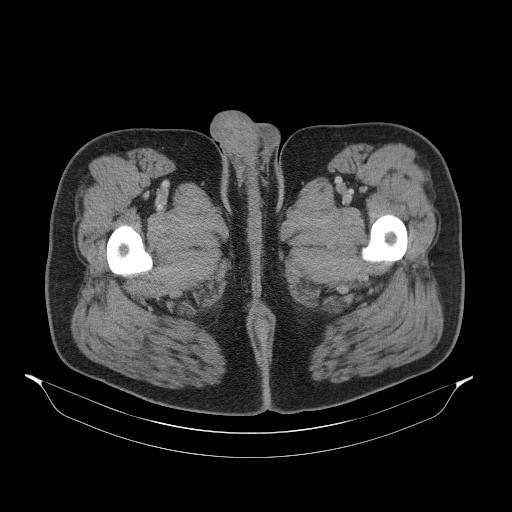
[im 8/108  bone]
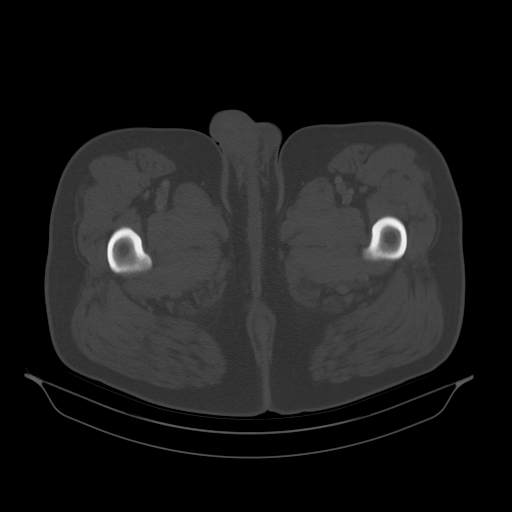
[im 15/108  soft-tissue]
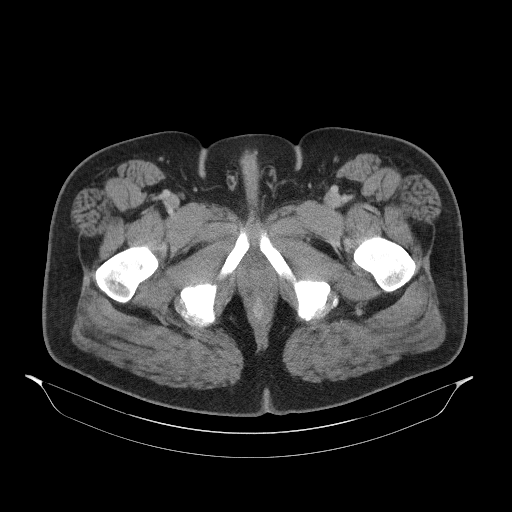
[im 22/108  soft-tissue]
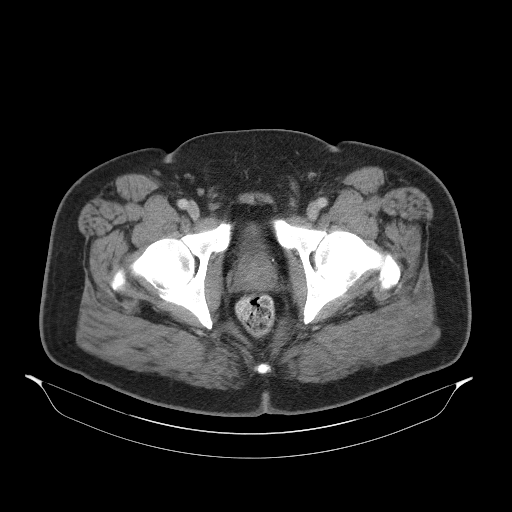
[im 29/108  soft-tissue]
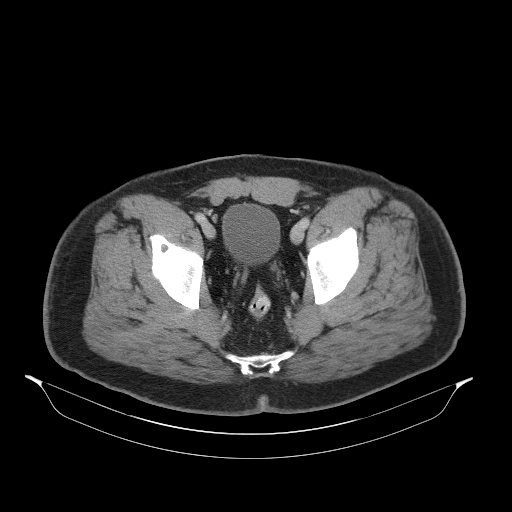
[im 36/108  soft-tissue]
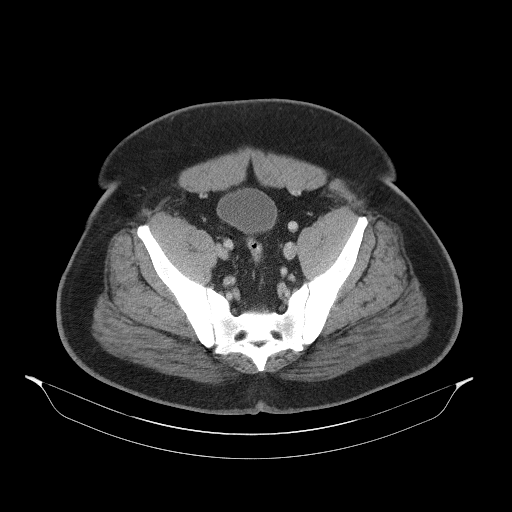
[im 43/108  soft-tissue]
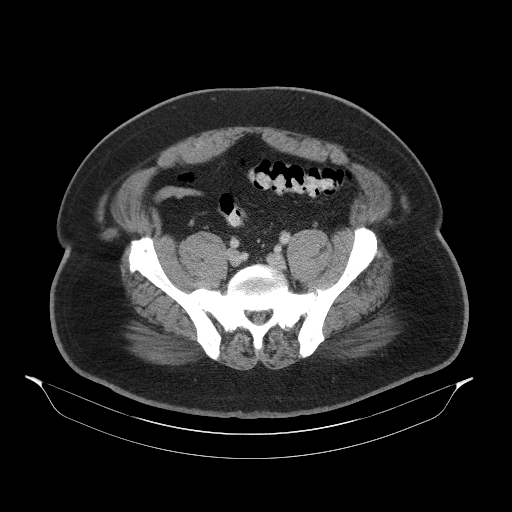
[im 58/108  soft-tissue]
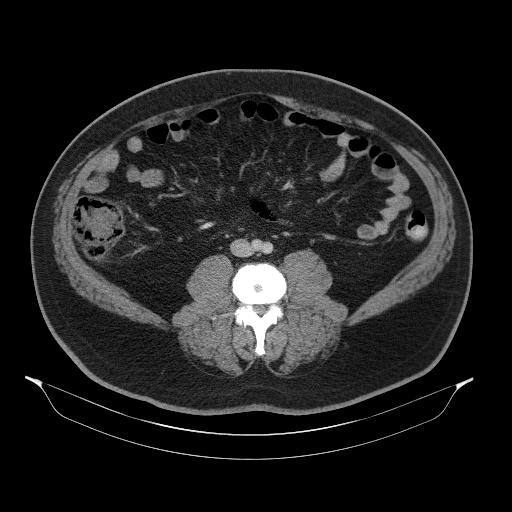
[im 65/108  soft-tissue]
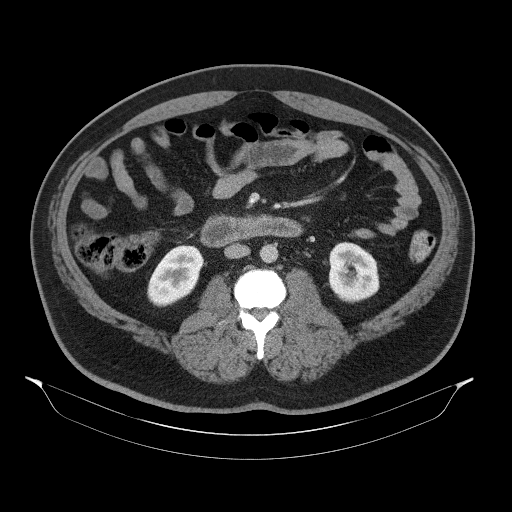
[im 72/108  soft-tissue]
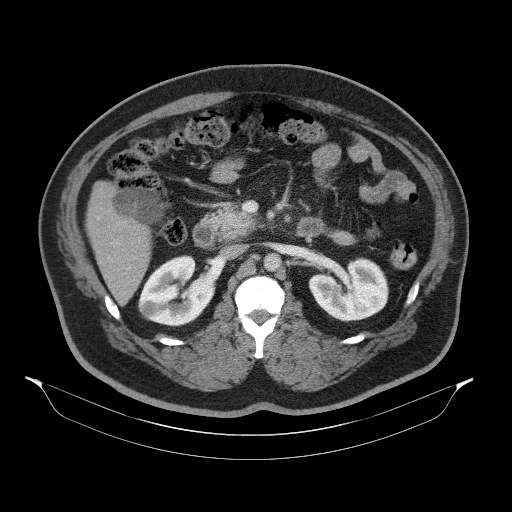
[im 72/108  bone]
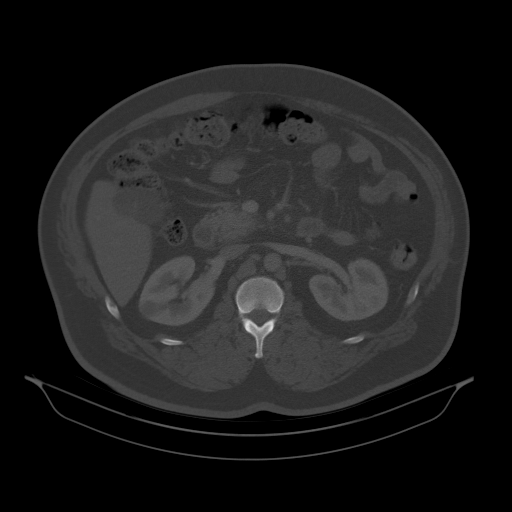
[im 79/108  soft-tissue]
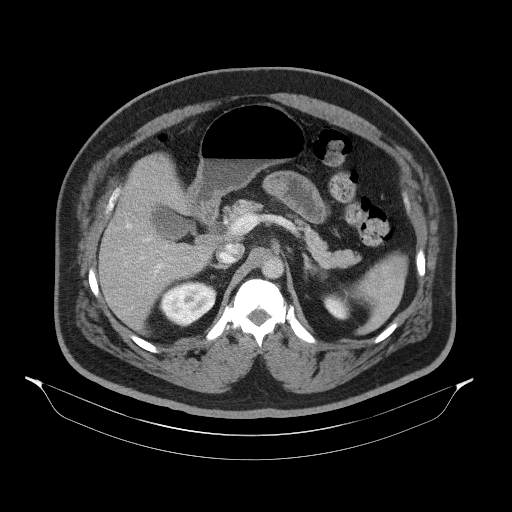
[im 79/108  lung]
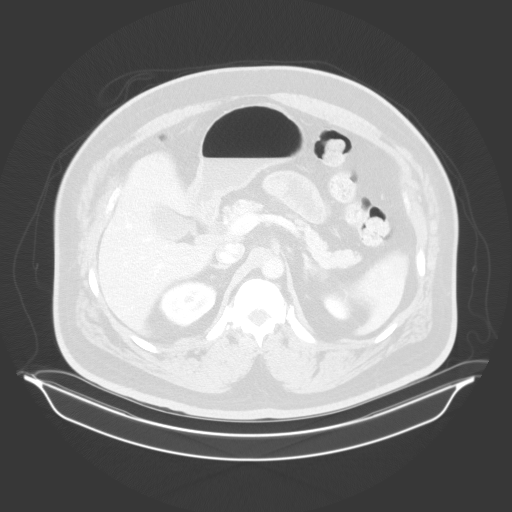
[im 86/108  soft-tissue]
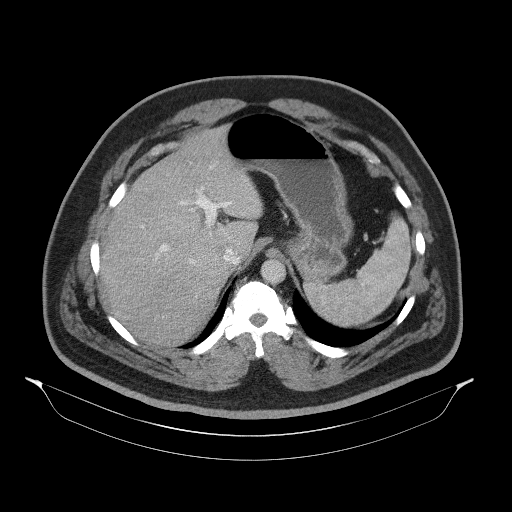
[im 86/108  lung]
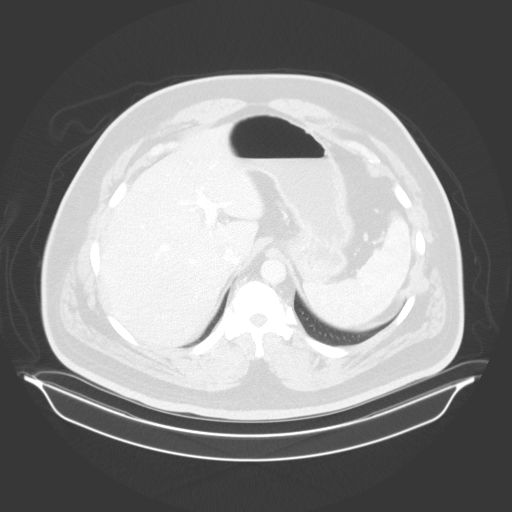
[im 93/108  soft-tissue]
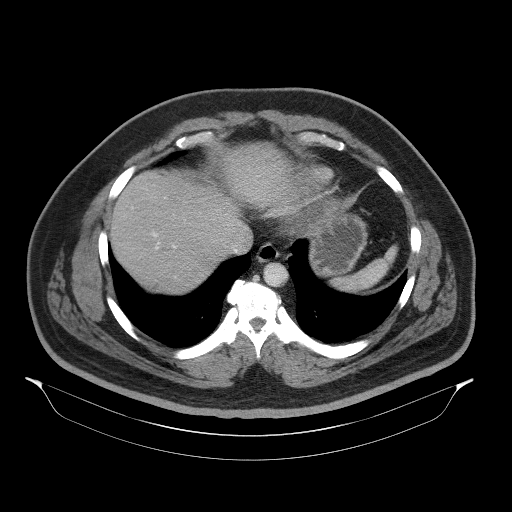
[im 93/108  lung]
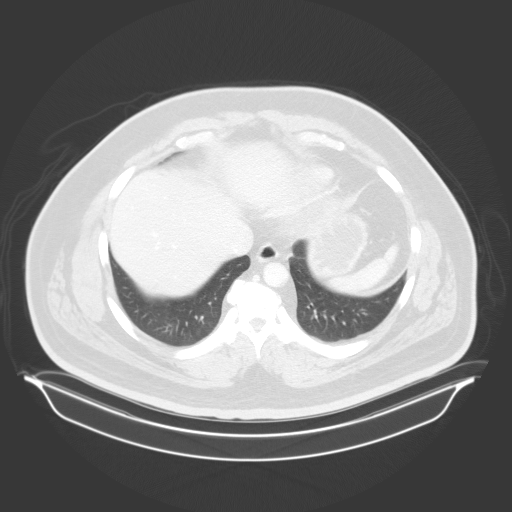
[im 100/108  soft-tissue]
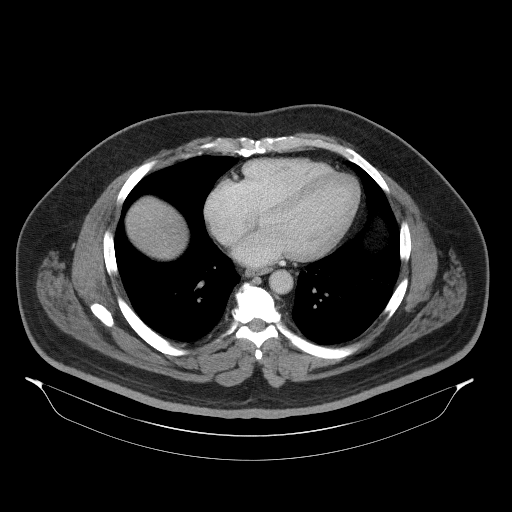
[im 100/108  lung]
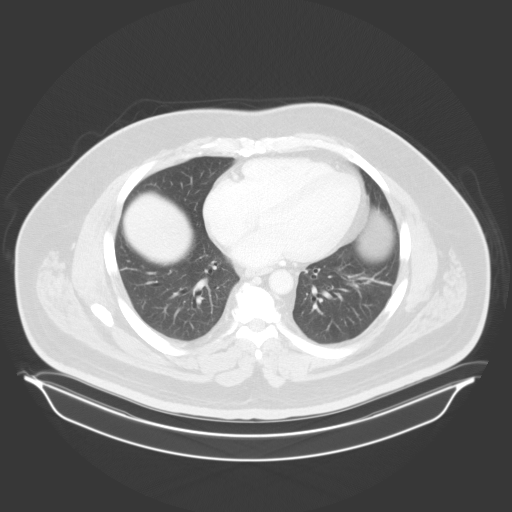

[13 of 32 positions shown; findings below may reference images not displayed]

FINDINGS: Lower chest: Linear atelectasis or scarring in the left lower lobe.
Otherwise normal.

Hepatobiliary: Normal appearance of the liver. No calcified
gallstones.

Pancreas: No evidence of pancreatic mass. Question mild stranding
around the head of the pancreas that could go along with low level
pancreatitis. Does the patient have laboratory evidence of that?

Spleen: Normal

Adrenals/Urinary Tract: Adrenal glands are normal. Small renal
cysts. No stone, mass or hydronephrosis. Bladder is normal.

Stomach/Bowel: Bowel appears normal. No evidence of obstruction or
inflammatory disease. Appendix is normal. No diverticulosis or
diverticulitis.

Vascular/Lymphatic: Aorta is normal. No visible atherosclerotic
change. IVC is normal. No retroperitoneal adenopathy.

Reproductive: Normal

Other: No free fluid or air.

Musculoskeletal: Ordinary mild lumbar degenerative changes.
IMPRESSION: Mild edema surrounding the pancreatic head raises the question of
mild pancreatitis. No advanced finding. No sign of pancreatic mass
at this time. Does the patient have laboratory evidence of
pancreatitis?

Mild linear atelectasis or scarring in the left lower lobe.

No primary bowel pathology seen. Patient does not have an increased
amount of fecal matter.

## 2019-11-20 ENCOUNTER — Inpatient Hospital Stay (HOSPITAL_COMMUNITY)
Admission: EM | Admit: 2019-11-20 | Discharge: 2019-11-26 | DRG: 177 | Disposition: A | Discharge status: Home / Self Care | Payer: BC Managed Care – PPO | Attending: Internal Medicine | Admitting: Internal Medicine

## 2019-11-20 ENCOUNTER — Emergency Department (HOSPITAL_COMMUNITY): Payer: BC Managed Care – PPO

## 2019-11-20 ENCOUNTER — Other Ambulatory Visit: Payer: Self-pay

## 2019-11-20 ENCOUNTER — Encounter (HOSPITAL_COMMUNITY): Payer: Self-pay | Admitting: Emergency Medicine

## 2019-11-20 DIAGNOSIS — G8929 Other chronic pain: Secondary | ICD-10-CM | POA: Diagnosis present

## 2019-11-20 DIAGNOSIS — J1289 Other viral pneumonia: Secondary | ICD-10-CM | POA: Diagnosis present

## 2019-11-20 DIAGNOSIS — E872 Acidosis, unspecified: Secondary | ICD-10-CM | POA: Diagnosis present

## 2019-11-20 DIAGNOSIS — M48061 Spinal stenosis, lumbar region without neurogenic claudication: Secondary | ICD-10-CM | POA: Diagnosis present

## 2019-11-20 DIAGNOSIS — I10 Essential (primary) hypertension: Secondary | ICD-10-CM | POA: Diagnosis present

## 2019-11-20 DIAGNOSIS — E1165 Type 2 diabetes mellitus with hyperglycemia: Secondary | ICD-10-CM | POA: Diagnosis present

## 2019-11-20 DIAGNOSIS — M109 Gout, unspecified: Secondary | ICD-10-CM | POA: Diagnosis present

## 2019-11-20 DIAGNOSIS — E785 Hyperlipidemia, unspecified: Secondary | ICD-10-CM | POA: Diagnosis present

## 2019-11-20 DIAGNOSIS — R7401 Elevation of levels of liver transaminase levels: Secondary | ICD-10-CM | POA: Diagnosis present

## 2019-11-20 DIAGNOSIS — J9690 Respiratory failure, unspecified, unspecified whether with hypoxia or hypercapnia: Secondary | ICD-10-CM | POA: Diagnosis present

## 2019-11-20 DIAGNOSIS — U071 COVID-19: Secondary | ICD-10-CM | POA: Diagnosis present

## 2019-11-20 DIAGNOSIS — E876 Hypokalemia: Secondary | ICD-10-CM | POA: Diagnosis present

## 2019-11-20 DIAGNOSIS — Z794 Long term (current) use of insulin: Secondary | ICD-10-CM | POA: Diagnosis not present

## 2019-11-20 DIAGNOSIS — D696 Thrombocytopenia, unspecified: Secondary | ICD-10-CM | POA: Diagnosis present

## 2019-11-20 DIAGNOSIS — E119 Type 2 diabetes mellitus without complications: Secondary | ICD-10-CM | POA: Diagnosis not present

## 2019-11-20 DIAGNOSIS — Z791 Long term (current) use of non-steroidal anti-inflammatories (NSAID): Secondary | ICD-10-CM

## 2019-11-20 DIAGNOSIS — N179 Acute kidney failure, unspecified: Secondary | ICD-10-CM

## 2019-11-20 DIAGNOSIS — J9601 Acute respiratory failure with hypoxia: Secondary | ICD-10-CM

## 2019-11-20 DIAGNOSIS — E871 Hypo-osmolality and hyponatremia: Secondary | ICD-10-CM | POA: Diagnosis present

## 2019-11-20 DIAGNOSIS — E861 Hypovolemia: Secondary | ICD-10-CM | POA: Diagnosis present

## 2019-11-20 DIAGNOSIS — Z6837 Body mass index (BMI) 37.0-37.9, adult: Secondary | ICD-10-CM | POA: Diagnosis not present

## 2019-11-20 DIAGNOSIS — Z7982 Long term (current) use of aspirin: Secondary | ICD-10-CM

## 2019-11-20 DIAGNOSIS — D72819 Decreased white blood cell count, unspecified: Secondary | ICD-10-CM | POA: Diagnosis present

## 2019-11-20 DIAGNOSIS — D6959 Other secondary thrombocytopenia: Secondary | ICD-10-CM | POA: Diagnosis present

## 2019-11-20 DIAGNOSIS — R0902 Hypoxemia: Secondary | ICD-10-CM | POA: Diagnosis present

## 2019-11-20 HISTORY — DX: COVID-19: U07.1

## 2019-11-20 LAB — COMPREHENSIVE METABOLIC PANEL
ALT: 48 U/L — ABNORMAL HIGH (ref 0–44)
AST: 94 U/L — ABNORMAL HIGH (ref 15–41)
Albumin: 3.1 g/dL — ABNORMAL LOW (ref 3.5–5.0)
Alkaline Phosphatase: 87 U/L (ref 38–126)
Anion gap: 13 (ref 5–15)
BUN: 28 mg/dL — ABNORMAL HIGH (ref 8–23)
CO2: 23 mmol/L (ref 22–32)
Calcium: 8.4 mg/dL — ABNORMAL LOW (ref 8.9–10.3)
Chloride: 95 mmol/L — ABNORMAL LOW (ref 98–111)
Creatinine, Ser: 1.44 mg/dL — ABNORMAL HIGH (ref 0.61–1.24)
GFR calc Af Amer: >60 mL/min (ref >60)
GFR calc non Af Amer: 52 mL/min — ABNORMAL LOW (ref >60)
Glucose, Bld: 294 mg/dL — ABNORMAL HIGH (ref 70–99)
Potassium: 3.7 mmol/L (ref 3.5–5.1)
Sodium: 131 mmol/L — ABNORMAL LOW (ref 135–145)
Total Bilirubin: 1.1 mg/dL (ref 0.3–1.2)
Total Protein: 7.3 g/dL (ref 6.5–8.1)

## 2019-11-20 LAB — CBC WITH DIFFERENTIAL/PLATELET
Abs Immature Granulocytes: 0.01 10*3/uL (ref 0.00–0.07)
Basophils Absolute: 0 10*3/uL (ref 0.0–0.1)
Basophils Relative: 0 %
Eosinophils Absolute: 0 10*3/uL (ref 0.0–0.5)
Eosinophils Relative: 0 %
HCT: 48.1 % (ref 39.0–52.0)
Hemoglobin: 16.6 g/dL (ref 13.0–17.0)
Immature Granulocytes: 0 %
Lymphocytes Relative: 28 %
Lymphs Abs: 1.3 10*3/uL (ref 0.7–4.0)
MCH: 29 pg (ref 26.0–34.0)
MCHC: 34.5 g/dL (ref 30.0–36.0)
MCV: 84.1 fL (ref 80.0–100.0)
Monocytes Absolute: 0.5 10*3/uL (ref 0.1–1.0)
Monocytes Relative: 12 %
Neutro Abs: 2.8 10*3/uL (ref 1.7–7.7)
Neutrophils Relative %: 60 %
Platelets: 97 10*3/uL — ABNORMAL LOW (ref 150–400)
RBC: 5.72 MIL/uL (ref 4.22–5.81)
RDW: 12.8 % (ref 11.5–15.5)
WBC: 4.6 10*3/uL (ref 4.0–10.5)
nRBC: 0 % (ref 0.0–0.2)

## 2019-11-20 LAB — OSMOLALITY, URINE: Osmolality, Ur: 439 mOsm/kg (ref 300–900)

## 2019-11-20 LAB — C-REACTIVE PROTEIN: CRP: 7.6 mg/dL — ABNORMAL HIGH (ref <1.0)

## 2019-11-20 LAB — POC SARS CORONAVIRUS 2 AG -  ED: SARS Coronavirus 2 Ag: POSITIVE — AB

## 2019-11-20 LAB — LACTIC ACID, PLASMA
Lactic Acid, Venous: 2.6 mmol/L (ref 0.5–1.9)
Lactic Acid, Venous: 1.8 mmol/L (ref 0.5–1.9)

## 2019-11-20 LAB — SODIUM, URINE, RANDOM: Sodium, Ur: <10 mmol/L

## 2019-11-20 LAB — CBG MONITORING, ED: Glucose-Capillary: 317 mg/dL — ABNORMAL HIGH (ref 70–99)

## 2019-11-20 LAB — PROCALCITONIN: Procalcitonin: 0.34 ng/mL

## 2019-11-20 LAB — OSMOLALITY: Osmolality: 299 mOsm/kg — ABNORMAL HIGH (ref 275–295)

## 2019-11-20 MED ORDER — VITAMIN C 500 MG PO TABS
500.0000 mg | ORAL_TABLET | Freq: Every day | ORAL | Status: DC
  Administered 2019-11-20 – 2019-11-26 (×7): 500 mg via ORAL
  Filled 2019-11-20 (×7): qty 1

## 2019-11-20 MED ORDER — DEXAMETHASONE SODIUM PHOSPHATE 10 MG/ML IJ SOLN
6.0000 mg | Freq: Once | INTRAMUSCULAR | Status: AC
  Administered 2019-11-20: 6 mg via INTRAVENOUS
  Filled 2019-11-20: qty 1

## 2019-11-20 MED ORDER — ASPIRIN EC 81 MG PO TBEC
81.0000 mg | DELAYED_RELEASE_TABLET | Freq: Every day | ORAL | Status: DC
  Administered 2019-11-20 – 2019-11-26 (×7): 81 mg via ORAL
  Filled 2019-11-20 (×7): qty 1

## 2019-11-20 MED ORDER — PRAVASTATIN SODIUM 10 MG PO TABS
20.0000 mg | ORAL_TABLET | Freq: Every day | ORAL | Status: DC
  Filled 2019-11-20: qty 2

## 2019-11-20 MED ORDER — ALLOPURINOL 100 MG PO TABS
300.0000 mg | ORAL_TABLET | Freq: Every day | ORAL | Status: DC
  Administered 2019-11-21 – 2019-11-26 (×6): 300 mg via ORAL
  Filled 2019-11-20: qty 1
  Filled 2019-11-20 (×2): qty 3
  Filled 2019-11-20 (×2): qty 1
  Filled 2019-11-20 (×2): qty 3

## 2019-11-20 MED ORDER — OMEGA-3-ACID ETHYL ESTERS 1 G PO CAPS: 1.0000 g | ORAL_CAPSULE | Freq: Every day | ORAL | Status: DC

## 2019-11-20 MED ORDER — INSULIN ASPART 100 UNIT/ML ~~LOC~~ SOLN
0.0000 [IU] | Freq: Three times a day (TID) | SUBCUTANEOUS | Status: DC
  Administered 2019-11-21: 11 [IU] via SUBCUTANEOUS
  Administered 2019-11-21: 15 [IU] via SUBCUTANEOUS
  Administered 2019-11-21 – 2019-11-22 (×2): 11 [IU] via SUBCUTANEOUS
  Administered 2019-11-22: 15 [IU] via SUBCUTANEOUS
  Administered 2019-11-22: 8 [IU] via SUBCUTANEOUS

## 2019-11-20 MED ORDER — SODIUM CHLORIDE 0.9 % IV SOLN
200.0000 mg | Freq: Once | INTRAVENOUS | Status: AC
  Administered 2019-11-20: 200 mg via INTRAVENOUS
  Filled 2019-11-20: qty 200

## 2019-11-20 MED ORDER — ACETAMINOPHEN 500 MG PO TABS
1000.0000 mg | ORAL_TABLET | Freq: Once | ORAL | Status: AC
  Administered 2019-11-20: 1000 mg via ORAL
  Filled 2019-11-20: qty 2

## 2019-11-20 MED ORDER — DEXAMETHASONE SODIUM PHOSPHATE 10 MG/ML IJ SOLN
6.0000 mg | INTRAMUSCULAR | Status: DC
  Administered 2019-11-21 – 2019-11-26 (×6): 6 mg via INTRAVENOUS
  Filled 2019-11-20 (×8): qty 1

## 2019-11-20 MED ORDER — SODIUM CHLORIDE 0.9 % IV SOLN
100.0000 mg | Freq: Every day | INTRAVENOUS | Status: AC
  Administered 2019-11-21 – 2019-11-24 (×4): 100 mg via INTRAVENOUS
  Filled 2019-11-20: qty 20
  Filled 2019-11-20: qty 100
  Filled 2019-11-20 (×2): qty 20

## 2019-11-20 MED ORDER — ATENOLOL 50 MG PO TABS
50.0000 mg | ORAL_TABLET | Freq: Every day | ORAL | Status: DC
  Administered 2019-11-20 – 2019-11-26 (×7): 50 mg via ORAL
  Filled 2019-11-20 (×7): qty 1

## 2019-11-20 MED ORDER — ENOXAPARIN SODIUM 40 MG/0.4ML ~~LOC~~ SOLN
40.0000 mg | SUBCUTANEOUS | Status: DC
  Administered 2019-11-20 – 2019-11-22 (×3): 40 mg via SUBCUTANEOUS
  Filled 2019-11-20 (×3): qty 0.4

## 2019-11-20 MED ORDER — SODIUM CHLORIDE 0.9 % IV SOLN
INTRAVENOUS | Status: AC
  Administered 2019-11-20 – 2019-11-21 (×2): via INTRAVENOUS

## 2019-11-20 MED ORDER — ZINC SULFATE 220 (50 ZN) MG PO CAPS
220.0000 mg | ORAL_CAPSULE | Freq: Every day | ORAL | Status: DC
  Administered 2019-11-20 – 2019-11-26 (×7): 220 mg via ORAL
  Filled 2019-11-20 (×7): qty 1

## 2019-11-20 NOTE — ED Notes (Signed)
Upon bringing pt to room, pt O2 saturation was 85-86% on RA at rest; pt was placed on 3L O2 [place] and pt is now having saturations at 92%+

## 2019-11-20 NOTE — H&P (Signed)
History and Physical    JANIS CUFFE ZOX:[id number] DOB: 1956/06/17 DOA: 11/20/2019  PCP: Jonathon Jordan, MD  Patient coming from: [facility] ED  Chief Complaint: SOB, weakness  HPI: Mr. Holloran is a 63 y/o African-American male with a pertinent history of HTN, DM, obesity who has been diagnosed with Covid.  Patient states that initially his symptoms presented at the end of October.  He developed inability to smell and weakness but an initial Covid test was negative.  He continued to become more more short of breath and his wife became positive with Covid.  He had a virtual visit with his primary care physician who adjusted his basal insulin dosing to just 30 units daily from twice daily and [place] his blood pressure medicine atenolol-chlorthalidone in half as well.  Associated symptoms are fever.  Patient states her last couple days he has been eating well, the wife concurs.  Poor p.o. intake and limited fluid intake.  Patient states that he has a history of spinal stenosis but no longer takes his pain medicines anymore.  With the decrease of his insulin to 30 units he says his blood sugars have been in the 200s.  On 30 twice a day he denied any hypoglycemic episodes  ED Course:  Initial the patient was breathing okay but upon ambulation he desatted into the 80s and required oxygen.  His oxygen saturations were around 90 to 92% and improved on 1 to 2 L of oxygen.  HR 86, RR 20, temp of 103  Recent Labs  Lab 11/20/19 1626  NA 131*  K 3.7  CO2 23  BUN 28*  CREATININE 1.44*  WBC 4.6  HGB 16.6  PLT 97*    CXR -1. Patchy bilateral interstitial and airspace opacities likely reflect COVID-19 pneumonia.  Interventions -patient was started on Decadron in the emergency department and started on fluids   I have personally briefly reviewed patient's old medical records in [place] of Systems: As per HPI otherwise 10 point review of systems negative.  Other pertinents as below:   General -  HEENT - still has inability to smell, denies rhinorrhea or sore throat Cardio -patient denies any chest pain or palpitations GI -he had diarrhea x2 and otherwise denies any nausea, vomiting, diarrhea, GI pain, hematochezia or melena GU -denies any urinary changes MSK -denies any joint changes or pains, Skin -denies any new skin changes Neuro -denies any new numbness or weakness Psych -states his mood is appropriate   Past Medical History:  Diagnosis Date  . COVID-19   . Diabetes mellitus without complication ([place])   . H/O seasonal allergies   . Hypertension   . Spinal stenosis of lumbar region     Past Surgical History:  Procedure Laterality Date  . COLONOSCOPY W/ POLYPECTOMY    . LUMBAR LAMINECTOMY/DECOMPRESSION MICRODISCECTOMY N/A 04/06/2013   Procedure: LUMBAR LAMINECTOMY/DECOMPRESSION MICRODISCECTOMY L4-L5;  Surgeon: Johnn Hai, MD;  Location: [facility];  Service: Orthopedics;  Laterality: N/A;     reports that he has never smoked. He has never used smokeless tobacco. He reports that he does not drink alcohol or use drugs.  Allergies  Allergen Reactions  . Other Other (See Comments)    Prevpack:made his tongue swell    No family history on file. Wife is also positive for Covid   Prior to Admission medications   Medication Sig Start Date End Date Taking? Authorizing Provider  allopurinol (ZYLOPRIM) 100 MG tablet Take 300 mg by mouth  daily before breakfast.     [provider]  aspirin EC 81 MG tablet Take 1 tablet (81 mg total) by mouth daily. States last dose will be 03/29/13 04/07/13   Dorothy Spark, PA-C  atenolol-chlorthalidone (TENORETIC) 100-25 MG per tablet Take 1 tablet by mouth daily before breakfast.     [provider]  cetirizine (ZYRTEC) 10 MG tablet Take 10 mg by mouth daily.    [provider]  CHERRY CONCENTRATE PO Take 2 capsules by mouth daily. States will stop 03/29/13    [provider]  gabapentin  (NEURONTIN) 300 MG capsule Take 300 mg by mouth 3 (three) times daily.    [provider]  insulin aspart (NOVOLOG) 100 UNIT/ML injection Inject 0-4 Units into the skin daily as needed for high blood sugar (only if sugar is over 200). Per sliding scale over 200 uses 4 units    [provider]  insulin detemir (LEVEMIR) 100 UNIT/ML injection Inject 6 Units into the skin daily with breakfast.     [provider]  meloxicam (MOBIC) 15 MG tablet Take 15 mg by mouth daily.    [provider]  methocarbamol (ROBAXIN) 500 MG tablet Take 1 tablet (500 mg total) by mouth 3 (three) times daily. 04/06/13   Jene Every, MD  omega-3 acid ethyl esters (LOVAZA) 1 G capsule Take 1 g by mouth daily. States will stop 03/29/13    [provider]  oxyCODONE-acetaminophen (PERCOCET) 7.5-325 MG per tablet Take 1-2 tablets by mouth every 4 (four) hours as needed for pain. 04/06/13   Jene Every, MD  pravastatin (PRAVACHOL) 40 MG tablet Take 20 mg by mouth daily before breakfast.     [provider]    Physical Exam: Vitals:   11/20/19 1758 11/20/19 1800 11/20/19 1815 11/20/19 1830  BP:  115/81 131/90 121/86  Pulse: 86 85 85 82  Resp:      Temp:      TempSrc:      SpO2: 91% 92% 94% 92%    Constitutional: NAD, comfortable watching football Eyes: pupils equal and reactive to light, anicteric, without injection ENMT: MMM, wearing a mask Neck: normal, supple, no masses, no thyromegaly noted Respiratory: Normal work of breathing with nasal cannula on, no cough noted, no wheezing noted from a far Cardiovascular: Well-perfused in the extremities, no lower extremity edema Abdomen: Nontender, soft, no hepatomegaly felt Musculoskeletal: moving all 4 extremities, strength grossly intact 5/5 in the UE and LE's,  Skin: no lesions or rashes Neurologic: CN 2-12 grossly intact. Sensation intact Psychiatric: AO appearing, mentation appropriate  Labs on Admission: I  have personally reviewed following labs and imaging studies  CBC: Recent Labs  Lab 11/20/19 1626  WBC 4.6  NEUTROABS 2.8  HGB 16.6  HCT 48.1  MCV 84.1  PLT 97*   Basic Metabolic Panel: Recent Labs  Lab 11/20/19 1626  NA 131*  K 3.7  CL 95*  CO2 23  GLUCOSE 294*  BUN 28*  CREATININE 1.44*  CALCIUM 8.4*   GFR: CrCl cannot be calculated (Unknown ideal weight.). Liver Function Tests: Recent Labs  Lab 11/20/19 1626  AST 94*  ALT 48*  ALKPHOS 87  BILITOT 1.1  PROT 7.3  ALBUMIN 3.1*   No results for input(s): LIPASE, AMYLASE in the last 168 hours. No results for input(s): AMMONIA in the last 168 hours. Coagulation Profile: No results for input(s): INR, PROTIME in the last 168 hours. Cardiac Enzymes: No results for input(s): CKTOTAL,  CKMB, CKMBINDEX, TROPONINI in the last 168 hours. BNP (last 3 results) No results for input(s): PROBNP in the last 8760 hours. HbA1C: No results for input(s): HGBA1C in the last 72 hours. CBG: No results for input(s): GLUCAP in the last 168 hours. Lipid Profile: No results for input(s): CHOL, HDL, LDLCALC, TRIG, CHOLHDL, LDLDIRECT in the last 72 hours. Thyroid Function Tests: No results for input(s): TSH, T4TOTAL, FREET4, T3FREE, THYROIDAB in the last 72 hours. Anemia Panel: No results for input(s): VITAMINB12, FOLATE, FERRITIN, TIBC, IRON, RETICCTPCT in the last 72 hours. Urine analysis: No results found for: COLORURINE, APPEARANCEUR, LABSPEC, PHURINE, GLUCOSEU, HGBUR, BILIRUBINUR, KETONESUR, PROTEINUR, UROBILINOGEN, NITRITE, LEUKOCYTESUR  Radiological Exams on Admission: Dg Chest Portable 1 View  Result Date: 11/20/2019 CLINICAL DATA:  Shortness of breath, COVID positive EXAM: PORTABLE CHEST 1 VIEW COMPARISON:  None. FINDINGS: The heart size is mildly enlarged. The lung volumes are low. Patchy bilateral interstitial and airspace opacities are noted. There is no pleural effusion or pneumothorax. The visualized skeletal structures  are unremarkable. IMPRESSION: 1. Patchy bilateral interstitial and airspace opacities likely reflect COVID-19 pneumonia. Electronically Signed   By: Romona Curls M.D.   On: 11/20/2019 17:22    EKG: Independently reviewed.  Has not been completed yet  Assessment/Plan Active Problems:   * No active hospital problems. *     #Acute hypoxic respiratory failure, POA, new #From Covid 19 -Decadron was started in the emergency department and will continue Decadron --Nebs every 6 hours as needed for shortness of breath and wheezing --I did not order telemetry --Monitor the patient closely for deterioration  #Lactic acidosis likely from hypovolemia, resolved with p.o. and IV fluids Wife concurs that he has not been eating very well. We will continue hydration and likely do a bolus. Repeat lactic acid and trend back to normal.  #Thrombocytopenia, unclear of chronicity, 2014 his platelet was 215, I expect to be a spurious lab -Await CBC in the morning, continue to monitor, wonder if from the virus and will improve with time -We will continue to monitor -No signs of bleeding at this time, will add on an INR to reassure against DIC or other hemolytic processes --consider peripheral smear --if plt go below 50, stop dvt ppx -Consider getting records from his primary care physician who he said he got lab work done in November.  #Hyponatremia, probably from hypovolemia and will improve with better hydration.  We will go ahead and add serum osmolality and urine sodium in case it does not improve in the morning  #AKI, expect prerenal.  Seems baseline is ~1.1, here is 1.4.  Expect will improve with fluids * * * *Fluids to end in the AM around 9am.  Likely switch to LR if  #Type 2 diabetes mellitus-need to clarify insulin he states that just recently he decreased his insulin to 30 basal insulin in the evening from 30 twice a day because of his poor p.o. intake.  I will keep at 30 units nightly and  consider adding on another 30 (or less) in the morning if patient is eating better  Chronic Comorbidities Gout-continue allopurinol 300 mg to continue in the morning Spinal stenosis of the lumbar region - - Chronic pain-we will hold meloxicam and oxycodone at this time, will add on Tylenol as needed for pain to protect the patient's respiratory status.  We will hold Robaxin Hypertension-we will continue atenolol at a lower dose of 50mg , but hold chlorthalidone because of the AKI.  Continue to watch his  blood pressures and can add back on the combo at a later time when patient is more hydratedrated. Hyperlipidemia-continue lovaza and pravastatin Seasonal allergies-we will hold Zyrtec at this time  Patient and/or Family completely agreed with the plan, expressed understanding and I answered all questions.  DVT prophylaxis: Lovenox SQ Code Status: Full code Family Communication: Wife Disposition Plan: Likely home when SOB resolves Consults called:  Admission status: inpatient   A total of 65 minutes utilized during this admission.  Charlane FerrettiAustin Lindy Garczynski DO [facility]   If 7PM-7AM, please contact night-coverage [email or web address] Password [email or web address]  11/20/2019, 7:07 PM

## 2019-11-20 NOTE — ED Notes (Signed)
POC CoV-2 COVID test result of Positive reported to Dr. Reather Converse.

## 2019-11-20 NOTE — ED Triage Notes (Signed)
C/o SOB and fever.  States he was tested on the 3rd for COVID and got + results back today.  Pt to treatment room.

## 2019-11-20 NOTE — ED Notes (Signed)
Wife- denise, [phone] for updates

## 2019-11-20 NOTE — ED Notes (Signed)
Pt wife Denis [phone]

## 2019-11-20 NOTE — ED Notes (Signed)
ED TO INPATIENT HANDOFF REPORT  ED Nurse Name and Phone #: Lunette Stands [phone]  S Name/Age/Gender [name] 63 y.o. male Room/Bed: 021C/021C  Code Status   Code Status: Full Code  Home/SNF/Other Home Patient oriented to: self, place, time and situation Is this baseline? Yes   Triage Complete: Triage complete  Chief Complaint covid + breathing difficult  Triage Note C/o SOB and fever.  States he was tested on the 3rd for COVID and got + results back today.  Pt to treatment room.   Allergies Allergies  Allergen Reactions  . Fenofibrate Other (See Comments)    Elevated LFTs   . Lisinopril Swelling    Per-orbital swelling  . Metformin Diarrhea  . Other Swelling    Prevpak (prevacid, amoxicillin, clarithromycin) caused tongue swelling    Level of Care/Admitting Diagnosis ED Disposition    ED Disposition Condition [facility] Area: [place] [[id number]]  Level of Care: Med-Surg [16]  Covid Evaluation: Confirmed COVID Positive  Diagnosis: COVID-19 [[id number]]  Admitting Physician: Sueanne Margarita [[id number]]  Attending Physician: Sueanne Margarita [[id number]]  Estimated length of stay: 3 - 4 days  Certification:: I certify this patient will need inpatient services for at least 2 midnights  PT Class (Do Not Modify): Inpatient [101]  PT Acc Code (Do Not Modify): Private [1]       B Medical/Surgery History Past Medical History:  Diagnosis Date  . COVID-19   . Diabetes mellitus without complication ([place])   . H/O seasonal allergies   . Hypertension   . Spinal stenosis of lumbar region    Past Surgical History:  Procedure Laterality Date  . COLONOSCOPY W/ POLYPECTOMY    . LUMBAR LAMINECTOMY/DECOMPRESSION MICRODISCECTOMY N/A 04/06/2013   Procedure: LUMBAR LAMINECTOMY/DECOMPRESSION MICRODISCECTOMY L4-L5;  Surgeon: Johnn Hai, MD;  Location: [facility];  Service: Orthopedics;  Laterality: N/A;     A IV  Location/Drains/Wounds Patient Lines/Drains/Airways Status   Active Line/Drains/Airways    Name:   Placement date:   Placement time:   Site:   Days:   Peripheral IV 11/20/19 Right;Upper Arm   11/20/19    1720    Arm   less than 1   Incision 04/06/13 Back Other (Comment)   04/06/13    0748     2419          Intake/Output Last 24 hours No intake or output data in the 24 hours ending 11/20/19 2229  Labs/Imaging Results for orders placed or performed during the hospital encounter of 11/20/19 (from the past 48 hour(s))  Comprehensive metabolic panel     Status: Abnormal   Collection Time: 11/20/19  4:26 PM  Result Value Ref Range   Sodium 131 (L) 135 - 145 mmol/L   Potassium 3.7 3.5 - 5.1 mmol/L   Chloride 95 (L) 98 - 111 mmol/L   CO2 23 22 - 32 mmol/L   Glucose, Bld 294 (H) 70 - 99 mg/dL   BUN 28 (H) 8 - 23 mg/dL   Creatinine, Ser 1.44 (H) 0.61 - 1.24 mg/dL   Calcium 8.4 (L) 8.9 - 10.3 mg/dL   Total Protein 7.3 6.5 - 8.1 g/dL   Albumin 3.1 (L) 3.5 - 5.0 g/dL   AST 94 (H) 15 - 41 U/L   ALT 48 (H) 0 - 44 U/L   Alkaline Phosphatase 87 38 - 126 U/L   Total Bilirubin 1.1 0.3 - 1.2 mg/dL   GFR calc non Af Amer 52 (L) >  60 mL/min   GFR calc Af Amer >60 >60 mL/min   Anion gap 13 5 - 15    Comment: Performed at [facility] [address], [place], [place] [address]  CBC with Differential     Status: Abnormal   Collection Time: 11/20/19  4:26 PM  Result Value Ref Range   WBC 4.6 4.0 - 10.5 K/uL   RBC 5.72 4.22 - 5.81 MIL/uL   Hemoglobin 16.6 13.0 - 17.0 g/dL   HCT 48.1 39.0 - 52.0 %   MCV 84.1 80.0 - 100.0 fL   MCH 29.0 26.0 - 34.0 pg   MCHC 34.5 30.0 - 36.0 g/dL   RDW 12.8 11.5 - 15.5 %   Platelets 97 (L) 150 - 400 K/uL    Comment: REPEATED TO VERIFY PLATELET COUNT CONFIRMED BY SMEAR Immature Platelet Fraction may be clinically indicated, consider ordering this additional test [id number]    nRBC 0.0 0.0 - 0.2 %   Neutrophils Relative % 60 %   Neutro Abs 2.8 1.7 - 7.7  K/uL   Lymphocytes Relative 28 %   Lymphs Abs 1.3 0.7 - 4.0 K/uL   Monocytes Relative 12 %   Monocytes Absolute 0.5 0.1 - 1.0 K/uL   Eosinophils Relative 0 %   Eosinophils Absolute 0.0 0.0 - 0.5 K/uL   Basophils Relative 0 %   Basophils Absolute 0.0 0.0 - 0.1 K/uL   Immature Granulocytes 0 %   Abs Immature Granulocytes 0.01 0.00 - 0.07 K/uL    Comment: Performed at [facility], [place] [address], [place], [place] [address]  Procalcitonin     Status: None   Collection Time: 11/20/19  4:26 PM  Result Value Ref Range   Procalcitonin 0.34 ng/mL    Comment:        Interpretation: PCT (Procalcitonin) <= 0.5 ng/mL: Systemic infection (sepsis) is not likely. Local bacterial infection is possible. (NOTE)       Sepsis PCT Algorithm           Lower Respiratory Tract                                      Infection PCT Algorithm    ----------------------------     ----------------------------         PCT < 0.25 ng/mL                PCT < 0.10 ng/mL         Strongly encourage             Strongly discourage   discontinuation of antibiotics    initiation of antibiotics    ----------------------------     -----------------------------       PCT 0.25 - 0.50 ng/mL            PCT 0.10 - 0.25 ng/mL               OR       >80% decrease in PCT            Discourage initiation of                                            antibiotics      Encourage discontinuation  of antibiotics    ----------------------------     -----------------------------         PCT >= 0.50 ng/mL              PCT 0.26 - 0.50 ng/mL               AND        <80% decrease in PCT             Encourage initiation of                                             antibiotics       Encourage continuation           of antibiotics    ----------------------------     -----------------------------        PCT >= 0.50 ng/mL                  PCT > 0.50 ng/mL               AND         increase in PCT                  Strongly  encourage                                      initiation of antibiotics    Strongly encourage escalation           of antibiotics                                     -----------------------------                                           PCT <= 0.25 ng/mL                                                 OR                                        > 80% decrease in PCT                                     Discontinue / Do not initiate                                             antibiotics Performed at [facility], [address], [place], [place] [address]   Lactic acid, plasma     Status: Abnormal   Collection Time: 11/20/19  5:07 PM  Result Value Ref Range   Lactic Acid, Venous 2.6 (HH) 0.5 -  1.9 mmol/L    Comment: CRITICAL RESULT CALLED TO, READ BACK BY AND VERIFIED WITHBurgess Amor RN AT 1750 11/20/2019 BY [facility] Performed at [facility], [place] [address], [place], [place] [address]   C-reactive protein     Status: Abnormal   Collection Time: 11/20/19  5:07 PM  Result Value Ref Range   CRP 7.6 (H) <1.0 mg/dL    Comment: Performed at [facility], [place] [address], [place], [place] [address]  Osmolality     Status: Abnormal   Collection Time: 11/20/19  5:07 PM  Result Value Ref Range   Osmolality 299 (H) 275 - 295 mOsm/kg    Comment: Performed at [facility], [place] [address], [place], [place] [address]  Lactic acid, plasma     Status: None   Collection Time: 11/20/19  7:20 PM  Result Value Ref Range   Lactic Acid, Venous 1.8 0.5 - 1.9 mmol/L    Comment: Performed at [facility] [address] [place], [place] [address]  POC SARS Coronavirus 2 Ag-ED - Nasal Swab (BD Veritor Kit)     Status: Abnormal   Collection Time: 11/20/19  7:49 PM  Result Value Ref Range   SARS Coronavirus 2 Ag POSITIVE (A) NEGATIVE    Comment: (NOTE) SARS-CoV-2 antigen PRESENT. Positive results indicate the presence of viral antigens, but clinical correlation with  patient history and other diagnostic information is necessary to determine patient infection status.  Positive results do not rule out bacterial infection or co-infection  with other viruses. False positive results are rare but can occur, and confirmatory RT-PCR testing may be appropriate in some circumstances. The expected result is Negative. Fact Sheet for Patients: PodPark.tn Fact Sheet for Providers: GiftContent.is  This test is not yet approved or cleared by the [place] FDA and  has been authorized for detection and/or diagnosis of SARS-CoV-2 by FDA under an Emergency Use Authorization (EUA).  This EUA will remain in effect (meaning this test can be used) for the duration of  the COVID-19 declaration under Section 564(b)(1) of the Act, 21 U.S.C. section 360bbb-3(b)(1), unless the a uthorization is terminated or revoked sooner.   Sodium, urine, random     Status: None   Collection Time: 11/20/19  9:17 PM  Result Value Ref Range   Sodium, Ur <10 mmol/L    Comment: Performed at [facility], [place] [address] [place], [place] [address]  Osmolality, urine     Status: None   Collection Time: 11/20/19  9:17 PM  Result Value Ref Range   Osmolality, Ur 439 300 - 900 mOsm/kg    Comment: Performed at [facility] [address] [place], [place] [address]  CBG monitoring, ED     Status: Abnormal   Collection Time: 11/20/19 10:12 PM  Result Value Ref Range   Glucose-Capillary 317 (H) 70 - 99 mg/dL   Dg Chest Portable 1 View  Result Date: 11/20/2019 CLINICAL DATA:  Shortness of breath, COVID positive EXAM: PORTABLE CHEST 1 VIEW COMPARISON:  None. FINDINGS: The heart size is mildly enlarged. The lung volumes are low. Patchy bilateral interstitial and airspace opacities are noted. There is no pleural effusion or pneumothorax. The visualized skeletal structures are unremarkable. IMPRESSION: 1. Patchy bilateral  interstitial and airspace opacities likely reflect COVID-19 pneumonia. Electronically Signed   By: Zerita Boers M.D.   On: 11/20/2019 17:22    Pending Labs Unresulted Labs (From admission, onward)  Start     Ordered   11/21/19 0500  CBC with Differential/Platelet  Daily,   R     11/20/19 1927   11/20/19 2126  INR once now  Add-on,   AD     11/20/19 2125   11/20/19 1928  HIV Antibody (routine testing w rflx)  (HIV Antibody (Routine testing w reflex) panel)  Once,   STAT     11/20/19 1927   11/20/19 1928  ABO/Rh  Once,   STAT     11/20/19 1927   11/20/19 1928  Brain natriuretic peptide  Add-on,   AD     11/20/19 1927   11/20/19 1928  Magnesium  Add-on,   AD     11/20/19 1927   11/20/19 1928  Culture, blood (Routine X 2) w Reflex to ID Panel  BLOOD CULTURE X 2,   R (with STAT occurrences)     11/20/19 1927   11/20/19 1928  Hemoglobin A1c  Once,   STAT    Comments: To assess prior glycemic control    11/20/19 1927          Vitals/Pain Today's Vitals   11/20/19 1800 11/20/19 1815 11/20/19 1830 11/20/19 2130  BP: 115/81 131/90 121/86 (!) 137/124  Pulse: 85 85 82 80  Resp:    20  Temp:    (!) 103.1 F (39.5 C)  TempSrc:    Oral  SpO2: 92% 94% 92% 94%  PainSc:        Isolation Precautions Airborne and Contact precautions  Medications Medications  0.9 %  sodium chloride infusion ( Intravenous New Bag/Given 11/20/19 1720)  allopurinol (ZYLOPRIM) tablet 300 mg (has no administration in time range)  aspirin EC tablet 81 mg (has no administration in time range)  pravastatin (PRAVACHOL) tablet 20 mg (has no administration in time range)  atenolol (TENORMIN) tablet 50 mg (has no administration in time range)  enoxaparin (LOVENOX) injection 40 mg (has no administration in time range)  dexamethasone (DECADRON) injection 6 mg (has no administration in time range)  vitamin C (ASCORBIC ACID) tablet 500 mg (has no administration in time range)  zinc sulfate capsule 220 mg (has no  administration in time range)  insulin aspart (novoLOG) injection 0-15 Units (has no administration in time range)  remdesivir 200 mg in sodium chloride 0.9% 250 mL IVPB (has no administration in time range)    Followed by  remdesivir 100 mg in sodium chloride 0.9 % 100 mL IVPB (has no administration in time range)  acetaminophen (TYLENOL) tablet 1,000 mg (1,000 mg Oral Given 11/20/19 1654)  dexamethasone (DECADRON) injection 6 mg (6 mg Intravenous Given 11/20/19 1758)    Mobility walks Low fall risk   Focused Assessments Pulmonary Assessment Handoff:  Lung sounds:   O2 Device: Room Air        R Recommendations: See Admitting Provider Note  Report given to:   Additional Notes: N/A

## 2019-11-20 NOTE — ED Provider Notes (Signed)
[facility] Provider Note   CSN: [id number] Arrival date & time: 11/20/19  1505     History   Chief Complaint Chief Complaint  Patient presents with  . COVID +  . Shortness of Breath  . Fever    HPI [name] is a 63 y.o. male.     Patient with history of diabetes, high blood pressure, obesity presents with worsening fever and shortness of breath.  Patient was diagnosed with Covid today, had the test on December 3.  Patient's wife also has Covid.  Patient has shortness of breath worsening throughout today.  Patient denies any cardiac or lung disease history.  Patient has had congestion and loss of taste.     Past Medical History:  Diagnosis Date  . COVID-19   . Diabetes mellitus without complication (HCC)   . H/O seasonal allergies   . Hypertension   . Spinal stenosis of lumbar region     Patient Active Problem List   Diagnosis Date Noted  . 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 COLONOSCOPY W/ POLYPECTOMY    . LUMBAR LAMINECTOMY/DECOMPRESSION MICRODISCECTOMY N/A 04/06/2013   Procedure: LUMBAR LAMINECTOMY/DECOMPRESSION MICRODISCECTOMY L4-L5;  Surgeon: Javier DockerJeffrey C Beane, MD;  Location: [facility] OR[facility];  Service: Orthopedics;  Laterality: N/A;        Home Medications    Prior to Admission medications   Medication Sig Start Date End Date Taking? Authorizing Provider  allopurinol (ZYLOPRIM) 100 MG tablet Take 300 mg by mouth daily before breakfast.     [provider]  aspirin EC 81 MG tablet Take 1 tablet (81 mg total) by mouth daily. States last dose will be 03/29/13 04/07/13   Dorothy SparkBissell, Jaclyn M, PA-C  atenolol-chlorthalidone (TENORETIC) 100-25 MG per tablet Take 1 tablet by mouth daily before breakfast.     [provider]  cetirizine (ZYRTEC) 10 MG tablet Take 10 mg by mouth daily.    [provider]  CHERRY  CONCENTRATE PO Take 2 capsules by mouth daily. States will stop 03/29/13    [provider]  gabapentin (NEURONTIN) 300 MG capsule Take 300 mg by mouth 3 (three) times daily.    [provider]  insulin aspart (NOVOLOG) 100 UNIT/ML injection Inject 0-4 Units into the skin daily as needed for high blood sugar (only if sugar is over 200). Per sliding scale over 200 uses 4 units    [provider]  insulin detemir (LEVEMIR) 100 UNIT/ML injection Inject 6 Units into the skin daily with breakfast.     [provider]  meloxicam (MOBIC) 15 MG tablet Take 15 mg by mouth daily.    [provider]  methocarbamol (ROBAXIN) 500 MG tablet Take 1 tablet (500 mg total) by mouth 3 (three) times daily. 04/06/13   Jene EveryBeane, Jeffrey, MD  omega-3 acid ethyl esters (LOVAZA) 1 G capsule Take 1 g by mouth daily. States will stop 03/29/13    [provider]  oxyCODONE-acetaminophen (PERCOCET) 7.5-325 MG per tablet Take 1-2 tablets by mouth every 4 (four) hours as needed for pain. 04/06/13   Jene EveryBeane, Jeffrey, MD  pravastatin (PRAVACHOL) 40 MG tablet Take 20 mg by mouth daily before breakfast.     [provider]    Family History No family history on file.  Social History Social History   Tobacco Use  . Smoking status: Never Smoker  . Smokeless tobacco: Never Used  Substance Use Topics  . Alcohol use: No  . Drug use: No     Allergies   Other   Review of Systems Review of Systems  Constitutional: Positive for fever. Negative for chills.  HENT: Positive for congestion.   Eyes: Negative for visual disturbance.  Respiratory: Positive for cough and shortness of breath.   Cardiovascular: Negative for chest pain.  Gastrointestinal: Negative for abdominal pain and vomiting.  Genitourinary: Negative for dysuria and flank pain.  Musculoskeletal: Negative for back pain, neck pain and neck stiffness.  Skin: Negative for rash.  Neurological: Negative for  light-headedness and headaches.     Physical Exam Updated Vital Signs BP 115/81   Pulse 85   Temp (!) 103.2 F (39.6 C) (Oral)   Resp 20   SpO2 92%   Physical Exam Vitals signs and nursing note reviewed.  Constitutional:      Appearance: He is well-developed.  HENT:     Head: Normocephalic and atraumatic.  Eyes:     General:        Right eye: No discharge.        Left eye: No discharge.     Conjunctiva/sclera: Conjunctivae normal.  Neck:     Musculoskeletal: Normal range of motion and neck supple.     Trachea: No tracheal deviation.  Cardiovascular:     Rate and Rhythm: Normal rate and regular rhythm.  Pulmonary:     Effort: Pulmonary effort is normal.     Breath sounds: Examination of the right-lower field reveals rhonchi. Examination of the left-lower field reveals rhonchi. Rhonchi present.  Abdominal:     General: There is no distension.     Palpations: Abdomen is soft.     Tenderness: There is no abdominal tenderness. There is no guarding.  Musculoskeletal:     Right lower leg: No edema.     Left lower leg: No edema.  Skin:    General: Skin is warm.     Findings: No rash.  Neurological:     General: No focal deficit present.     Mental Status: He is alert and oriented to person, place, and time.      ED Treatments / Results  Labs (all labs ordered are listed, but only abnormal results are displayed) Labs Reviewed  COMPREHENSIVE METABOLIC PANEL - Abnormal; Notable for the following components:      Result Value   Sodium 131 (*)    Chloride 95 (*)    Glucose, Bld 294 (*)    BUN 28 (*)    Creatinine, Ser 1.44 (*)    Calcium 8.4 (*)    Albumin 3.1 (*)    AST 94 (*)    ALT 48 (*)    GFR calc non Af Amer 52 (*)    All other components within normal limits  CBC WITH DIFFERENTIAL/PLATELET - Abnormal; Notable for the following components:   Platelets 97 (*)    All other components within normal limits  LACTIC ACID, PLASMA - Abnormal; Notable for the  following components:   Lactic Acid, Venous 2.6 (*)    All other components within normal limits  C-REACTIVE PROTEIN - Abnormal; Notable for the following components:   CRP 7.6 (*)    All other components within normal limits  PROCALCITONIN  LACTIC ACID, PLASMA    EKG None  Radiology Dg Chest Portable 1 View  Result Date: 11/20/2019 CLINICAL DATA:  Shortness of breath, COVID positive EXAM: PORTABLE CHEST 1 VIEW COMPARISON:  None.  FINDINGS: The heart size is mildly enlarged. The lung volumes are low. Patchy bilateral interstitial and airspace opacities are noted. There is no pleural effusion or pneumothorax. The visualized skeletal structures are unremarkable. IMPRESSION: 1. Patchy bilateral interstitial and airspace opacities likely reflect COVID-19 pneumonia. Electronically Signed   By: Zerita Boers M.D.   On: 11/20/2019 17:22    Procedures Procedures (including critical care time)  Medications Ordered in ED Medications  0.9 %  sodium chloride infusion ( Intravenous New Bag/Given 11/20/19 1720)  acetaminophen (TYLENOL) tablet 1,000 mg (1,000 mg Oral Given 11/20/19 1654)  dexamethasone (DECADRON) injection 6 mg (6 mg Intravenous Given 11/20/19 1758)     Initial Impression / Assessment and Plan / ED Course  I have reviewed the triage vital signs and the nursing notes.  Pertinent labs & imaging results that were available during my care of the patient were reviewed by me and considered in my medical decision making (see chart for details).        Patient presents with known Covid 19 diagnosis and worsening shortness of breath today.  Patient is febrile on exam, normal heart rate, no distress on 2 L nasal cannula.  General blood work ordered to check for anemia, kidney function and electrolytes.  Portable chest x-ray pending.  Plan for admission for further evaluation and treatment. Decadron ordered, hospitalist paged. Patient had sodium 131, creatinine 1.44.  Patient has had  decreased appetite recently with infection.  IV fluids at 125 mL rate given. ALTAN KRAAI was evaluated in Emergency Department on 11/20/2019 for the symptoms described in the history of present illness. He was evaluated in the context of the global COVID-19 pandemic, which necessitated consideration that the patient might be at risk for infection with the SARS-CoV-2 virus that causes COVID-19. Institutional protocols and algorithms that pertain to the evaluation of patients at risk for COVID-19 are in a state of rapid change based on information released by regulatory bodies including the CDC and federal and state organizations. These policies and algorithms were followed during the patient's care in the ED. Updated patient and his wife via phone. The patients results and plan were reviewed and discussed.   Any x-rays performed were independently reviewed by myself.   Differential diagnosis were considered with the presenting HPI.  Medications  0.9 %  sodium chloride infusion ( Intravenous New Bag/Given 11/20/19 1720)  acetaminophen (TYLENOL) tablet 1,000 mg (1,000 mg Oral Given 11/20/19 1654)  dexamethasone (DECADRON) injection 6 mg (6 mg Intravenous Given 11/20/19 1758)    Vitals:   11/20/19 1645 11/20/19 1715 11/20/19 1758 11/20/19 1800  BP: 126/85   115/81  Pulse:  88 86 85  Resp:      Temp:      TempSrc:      SpO2:  94% 91% 92%    Final diagnoses:  COVID-19  Hypoxia  Acute renal failure, unspecified acute renal failure type [facility])     Final Clinical Impressions(s) / ED Diagnoses   Final diagnoses:  COVID-19  Hypoxia  Acute renal failure, unspecified acute renal failure type [facility])    ED Discharge Orders    None       Elnora Morrison, MD 11/20/19 1821

## 2019-11-21 ENCOUNTER — Other Ambulatory Visit: Payer: Self-pay

## 2019-11-21 LAB — BRAIN NATRIURETIC PEPTIDE: B Natriuretic Peptide: 22.5 pg/mL (ref 0.0–100.0)

## 2019-11-21 LAB — CBC WITH DIFFERENTIAL/PLATELET
Abs Immature Granulocytes: 0.01 10*3/uL (ref 0.00–0.07)
Basophils Absolute: 0 10*3/uL (ref 0.0–0.1)
Basophils Relative: 0 %
Eosinophils Absolute: 0 10*3/uL (ref 0.0–0.5)
Eosinophils Relative: 0 %
HCT: 48.6 % (ref 39.0–52.0)
Hemoglobin: 16.3 g/dL (ref 13.0–17.0)
Immature Granulocytes: 0 %
Lymphocytes Relative: 40 %
Lymphs Abs: 1.2 10*3/uL (ref 0.7–4.0)
MCH: 29 pg (ref 26.0–34.0)
MCHC: 33.5 g/dL (ref 30.0–36.0)
MCV: 86.5 fL (ref 80.0–100.0)
Monocytes Absolute: 0.4 10*3/uL (ref 0.1–1.0)
Monocytes Relative: 14 %
Neutro Abs: 1.4 10*3/uL — ABNORMAL LOW (ref 1.7–7.7)
Neutrophils Relative %: 46 %
Platelets: 102 10*3/uL — ABNORMAL LOW (ref 150–400)
RBC: 5.62 MIL/uL (ref 4.22–5.81)
RDW: 13 % (ref 11.5–15.5)
WBC: 3 10*3/uL — ABNORMAL LOW (ref 4.0–10.5)
nRBC: 0 % (ref 0.0–0.2)

## 2019-11-21 LAB — GLUCOSE, CAPILLARY
Glucose-Capillary: 324 mg/dL — ABNORMAL HIGH (ref 70–99)
Glucose-Capillary: 332 mg/dL — ABNORMAL HIGH (ref 70–99)
Glucose-Capillary: 352 mg/dL — ABNORMAL HIGH (ref 70–99)

## 2019-11-21 LAB — PROTIME-INR
INR: 1 (ref 0.8–1.2)
Prothrombin Time: 13.1 seconds (ref 11.4–15.2)

## 2019-11-21 LAB — MAGNESIUM: Magnesium: 2 mg/dL (ref 1.7–2.4)

## 2019-11-21 LAB — HEMOGLOBIN A1C
Hgb A1c MFr Bld: 8.1 % — ABNORMAL HIGH (ref 4.8–5.6)
Mean Plasma Glucose: 185.77 mg/dL

## 2019-11-21 LAB — HIV ANTIBODY (ROUTINE TESTING W REFLEX): HIV Screen 4th Generation wRfx: NONREACTIVE

## 2019-11-21 LAB — ABO/RH: ABO/RH(D): O POS

## 2019-11-21 MED ORDER — LORATADINE 10 MG PO TABS
10.0000 mg | ORAL_TABLET | Freq: Every day | ORAL | Status: DC | PRN
  Administered 2019-11-21 – 2019-11-22 (×2): 10 mg via ORAL
  Filled 2019-11-21 (×2): qty 1

## 2019-11-21 MED ORDER — INSULIN ASPART 100 UNIT/ML ~~LOC~~ SOLN
6.0000 [IU] | Freq: Three times a day (TID) | SUBCUTANEOUS | Status: DC
  Administered 2019-11-21 (×2): 6 [IU] via SUBCUTANEOUS

## 2019-11-21 MED ORDER — PNEUMOCOCCAL VAC POLYVALENT 25 MCG/0.5ML IJ INJ
0.5000 mL | INJECTION | INTRAMUSCULAR | Status: DC | PRN
  Filled 2019-11-21: qty 0.5

## 2019-11-21 MED ORDER — ROSUVASTATIN CALCIUM 5 MG PO TABS
5.0000 mg | ORAL_TABLET | ORAL | Status: DC
  Administered 2019-11-26: 5 mg via ORAL
  Filled 2019-11-21 (×2): qty 1

## 2019-11-21 MED ORDER — INSULIN GLARGINE 100 UNIT/ML ~~LOC~~ SOLN
15.0000 [IU] | Freq: Every day | SUBCUTANEOUS | Status: DC
  Filled 2019-11-21: qty 0.15

## 2019-11-21 MED ORDER — INSULIN ASPART 100 UNIT/ML ~~LOC~~ SOLN: 3.0000 [IU] | Freq: Three times a day (TID) | SUBCUTANEOUS | Status: DC

## 2019-11-21 MED ORDER — ORAL CARE MOUTH RINSE
15.0000 mL | Freq: Two times a day (BID) | OROMUCOSAL | Status: DC
  Administered 2019-11-21 – 2019-11-26 (×11): 15 mL via OROMUCOSAL

## 2019-11-21 MED ORDER — IPRATROPIUM BROMIDE HFA 17 MCG/ACT IN AERS
2.0000 | INHALATION_SPRAY | Freq: Three times a day (TID) | RESPIRATORY_TRACT | Status: DC
  Administered 2019-11-21 – 2019-11-22 (×5): 2 via RESPIRATORY_TRACT
  Filled 2019-11-21: qty 12.9

## 2019-11-21 MED ORDER — INSULIN GLARGINE 100 UNIT/ML ~~LOC~~ SOLN
8.0000 [IU] | Freq: Every day | SUBCUTANEOUS | Status: DC
  Administered 2019-11-21: 8 [IU] via SUBCUTANEOUS
  Filled 2019-11-21: qty 0.08

## 2019-11-21 MED ORDER — VITAMIN D 25 MCG (1000 UNIT) PO TABS
1000.0000 [IU] | ORAL_TABLET | Freq: Every day | ORAL | Status: DC
  Administered 2019-11-21 – 2019-11-26 (×6): 1000 [IU] via ORAL
  Filled 2019-11-21 (×6): qty 1

## 2019-11-21 MED ORDER — INSULIN GLARGINE 100 UNIT/ML ~~LOC~~ SOLN
4.0000 [IU] | Freq: Every day | SUBCUTANEOUS | Status: DC
  Filled 2019-11-21: qty 0.04

## 2019-11-21 MED ORDER — ALBUTEROL SULFATE HFA 108 (90 BASE) MCG/ACT IN AERS
2.0000 | INHALATION_SPRAY | Freq: Three times a day (TID) | RESPIRATORY_TRACT | Status: DC
  Administered 2019-11-21 – 2019-11-22 (×5): 2 via RESPIRATORY_TRACT
  Filled 2019-11-21: qty 6.7

## 2019-11-21 MED ORDER — INSULIN GLARGINE 100 UNIT/ML ~~LOC~~ SOLN
7.0000 [IU] | Freq: Once | SUBCUTANEOUS | Status: AC
  Administered 2019-11-21: 7 [IU] via SUBCUTANEOUS
  Filled 2019-11-21: qty 0.07

## 2019-11-21 MED ORDER — PANTOPRAZOLE SODIUM 40 MG PO TBEC
40.0000 mg | DELAYED_RELEASE_TABLET | Freq: Every day | ORAL | Status: DC
  Administered 2019-11-21 – 2019-11-26 (×4): 40 mg via ORAL
  Filled 2019-11-21 (×6): qty 1

## 2019-11-21 NOTE — Progress Notes (Signed)
PROGRESS NOTE  Rodney RavenJohnny R Meyer WJX:[id number] DOB: 08/04/1956 DOA: 11/20/2019 PCP: Mila PalmerWolters, Sharon, MD  HPI/Recap of past 2124 hours: 63 year old male with past medical history significant for hypertension, type 2 diabetes, obesity, who presented [facility] ED from home with complaints of shortness of breath and weakness.  His symptoms started at the end of October.  Describes inability to smell with generalized weakness.  Initial Covid test was negative.  Has been increasingly becoming more dyspneic.  His wife tested positive for Covid.  On presentation, hypoxic on ambulation with desaturation in the 80s.  Chest x-ray showed patchy bilateral pulmonary infiltrates.  COVID-19 positive on 11/20/2019.  Admitted for acute COVID-19 viral pneumonia.  11/21/19: Seen and examined.  Reports breathing is improved, now on 5 L with O2 saturation in the low 90s.  On day number 2 out of 5 of remdesivir, also on IV Decadron to complete 10 days.  Assessment/Plan: Active Problems:   COVID-19   Respiratory failure (HCC)   Lactic acidosis   Thrombocytopenia (HCC)   Hyponatremia  Acute COVID-19 viral pneumonia Positive COVID-19 test on 11/20/2019. Independently reviewed chest x-ray admission which showed bilateral patchy infiltrates worse at bases. Started remdesivir, day number 2 out of 5 Continue Decadron to complete 10 days Continue albuterol inhaler and ipratropium inhaler Start vitamin C, D3, zinc Start incentive spirometer Maintain O2 saturation greater than 92% Continue to trend inflammatory markers  Acute hypoxic respiratory failure secondary to acute COVID-19 viral pneumonia No oxygen supplementation at baseline Management as per above.  AKI Baseline creatinine appears to be 1.1 with GFR greater than 60 from records Presented with creatinine of 1.4 with GFR of 52 Avoid nephrotoxins Monitor urine output Continue daily BMPs  Thrombocytopenia likely in the setting of acute illness Platelet count  is trending up No sign of overt bleeding  Leukopenia likely in the setting of acute covid-19 viral infection WBC 3.0 Continue to monitor with daily labs  Elevated AST ALT in the setting of Covid 19 viral infection Continue to closely monitor LFTs while on remdesivir and Crestor He takes Crestor every 72 hours  Type 2 diabetes with hyperglycemia Hemoglobin A1c 8.1 Continue insulin coverage Added Lantus daily and NovoLog TID   DVT prophylaxis: Lovenox SQ daily. Code Status: Full code Family Communication:  Will call his wife to give an update.  Disposition Plan[facility]  [facility]  Consults called:  None.    Objective: Vitals:   11/21/19 0017 11/21/19 0653 11/21/19 0800 11/21/19 0900  BP:  (!) 134/91 120/76   Pulse: 68 74 71   Resp:      Temp:  97.7 F (36.5 C)    TempSrc:  Oral    SpO2: 93% 91% 92% (!) 89%  Weight:      Height:        Intake/Output Summary (Last 24 hours) at 11/21/2019 1118 Last data filed at 11/21/2019 0700 Gross per 24 hour  Intake 740.44 ml  Output 675 ml  Net 65.44 ml   Filed Weights   11/21/19 0000  Weight: 114.4 kg    Exam:  . General: 63 y.o. year-old male well developed well nourished in no acute distress.  Alert and oriented x3. . Cardiovascular: Regular rate and rhythm with no rubs or gallops.  No thyromegaly or JVD noted.   Marland Kitchen. Respiratory: Rales at bases.  No wheezing noted.  Poor inspiratory effort.   . Abdomen: Obese nontender with normal bowel sounds x4 quadrants. . Musculoskeletal: No  lower extremity edema. 2/4 pulses in all 4 extremities. Marland Kitchen Psychiatry: Mood is appropriate for condition and setting   Data Reviewed: CBC: Recent Labs  Lab 11/20/19 1626 11/21/19 0500  WBC 4.6 3.0*  NEUTROABS 2.8 1.4*  HGB 16.6 16.3  HCT 48.1 48.6  MCV 84.1 86.5  PLT 97* 829*   Basic Metabolic Panel: Recent Labs  Lab 11/20/19 1626 11/21/19 0500  NA 131*  --   K 3.7  --   CL 95*  --   CO2 23  --   GLUCOSE 294*   --   BUN 28*  --   CREATININE 1.44*  --   CALCIUM 8.4*  --   MG  --  2.0   GFR: Estimated Creatinine Clearance: 67.2 mL/min (A) (by C-G formula based on SCr of 1.44 mg/dL (H)). Liver Function Tests: Recent Labs  Lab 11/20/19 1626  AST 94*  ALT 48*  ALKPHOS 87  BILITOT 1.1  PROT 7.3  ALBUMIN 3.1*   No results for input(s): LIPASE, AMYLASE in the last 168 hours. No results for input(s): AMMONIA in the last 168 hours. Coagulation Profile: Recent Labs  Lab 11/21/19 0500  INR 1.0   Cardiac Enzymes: No results for input(s): CKTOTAL, CKMB, CKMBINDEX, TROPONINI in the last 168 hours. BNP (last 3 results) No results for input(s): PROBNP in the last 8760 hours. HbA1C: Recent Labs    11/21/19 0500  HGBA1C 8.1*   CBG: Recent Labs  Lab 11/20/19 2212 11/21/19 0746  GLUCAP 317* 324*   Lipid Profile: No results for input(s): CHOL, HDL, LDLCALC, TRIG, CHOLHDL, LDLDIRECT in the last 72 hours. Thyroid Function Tests: No results for input(s): TSH, T4TOTAL, FREET4, T3FREE, THYROIDAB in the last 72 hours. Anemia Panel: No results for input(s): VITAMINB12, FOLATE, FERRITIN, TIBC, IRON, RETICCTPCT in the last 72 hours. Urine analysis: No results found for: COLORURINE, APPEARANCEUR, LABSPEC, PHURINE, GLUCOSEU, HGBUR, BILIRUBINUR, KETONESUR, PROTEINUR, UROBILINOGEN, NITRITE, LEUKOCYTESUR Sepsis Labs: @LABRCNTIP (procalcitonin:4,lacticidven:4)  )No results found for this or any previous visit (from the past 240 hour(s)).    Studies: Dg Chest Portable 1 View  Result Date: 11/20/2019 CLINICAL DATA:  Shortness of breath, COVID positive EXAM: PORTABLE CHEST 1 VIEW COMPARISON:  None. FINDINGS: The heart size is mildly enlarged. The lung volumes are low. Patchy bilateral interstitial and airspace opacities are noted. There is no pleural effusion or pneumothorax. The visualized skeletal structures are unremarkable. IMPRESSION: 1. Patchy bilateral interstitial and airspace opacities likely  reflect COVID-19 pneumonia. Electronically Signed   By: Zerita Boers M.D.   On: 11/20/2019 17:22    Scheduled Meds: . albuterol  2 puff Inhalation Q8H  . allopurinol  300 mg Oral QAC breakfast  . aspirin EC  81 mg Oral Daily  . atenolol  50 mg Oral Daily  . cholecalciferol  1,000 Units Oral Daily  . dexamethasone (DECADRON) injection  6 mg Intravenous Q24H  . enoxaparin (LOVENOX) injection  40 mg Subcutaneous Q24H  . insulin aspart  0-15 Units Subcutaneous TID WC  . ipratropium  2 puff Inhalation Q8H  . mouth rinse  15 mL Mouth Rinse BID  . rosuvastatin  5 mg Oral Q72H  . vitamin C  500 mg Oral Daily  . zinc sulfate  220 mg Oral Daily    Continuous Infusions: . remdesivir 100 mg in NS 100 mL       LOS: 1 day     Kayleen Memos, MD [facility] Pager [phone]  If 7PM-7AM, please contact night-coverage [email or web address] Password  [email or web address] 11/21/2019, 11:18 AM

## 2019-11-21 NOTE — Progress Notes (Signed)
ED documentation showed patient was on O2 4L [name]. ED RN placed patient on O2 5L when arriving to unit. Patient has remained 91-93% on O2 5L [name]. Lexine Baton, Agricultural consultant aware. [place], NP notified via text page. No distress noted. Will continue to monitor.

## 2019-11-21 NOTE — Progress Notes (Signed)
Pt's wife updated via phone. All questions/concerns addressed. Will contact again for any new concerns.

## 2019-11-21 NOTE — Progress Notes (Signed)
Inpatient Diabetes Program Recommendations  AACE/ADA: New Consensus Statement on Inpatient Glycemic Control (2015)  Target Ranges:  Prepandial:   less than 140 mg/dL      Peak postprandial:   less than 180 mg/dL (1-2 hours)      Critically ill patients:  140 - 180 mg/dL   Lab Results  Component Value Date   GLUCAP 332 (H) 11/21/2019   HGBA1C 8.1 (H) 11/21/2019    Review of Glycemic Control Results for ARIEL, WINGROVE (MRN [id number]) as of 11/21/2019 14:23  Ref. Range 11/20/2019 22:12 11/21/2019 07:46 11/21/2019 12:36  Glucose-Capillary Latest Ref Range: 70 - 99 mg/dL 317 (H) 324 (H) 332 (H)   Diabetes history: DM 2 Outpatient Diabetes medications:  Basalgar 30 units daily, Novolog 2-10 units tid with meals Current orders for Inpatient glycemic control:  Decadron 6 mg IV daily, Novolog moderate tid with meals Lantus 8 units daily, Novolog 6 units tid with meals  Inpatient Diabetes Program Recommendations:    Please increase Lantus to 30 units daily (this is home dose).  May need increase in dose based of fasting CBG's.    Thanks  Adah Perl, RN, BC-ADM Inpatient Diabetes Coordinator Pager [phone] (8a-5p)

## 2019-11-22 LAB — CBC WITH DIFFERENTIAL/PLATELET
Abs Immature Granulocytes: 0 10*3/uL (ref 0.00–0.07)
Basophils Absolute: 0 10*3/uL (ref 0.0–0.1)
Basophils Relative: 0 %
Eosinophils Absolute: 0 10*3/uL (ref 0.0–0.5)
Eosinophils Relative: 0 %
HCT: 47.5 % (ref 39.0–52.0)
Hemoglobin: 16.2 g/dL (ref 13.0–17.0)
Lymphocytes Relative: 17 %
Lymphs Abs: 1 10*3/uL (ref 0.7–4.0)
MCH: 29.1 pg (ref 26.0–34.0)
MCHC: 34.1 g/dL (ref 30.0–36.0)
MCV: 85.4 fL (ref 80.0–100.0)
Monocytes Absolute: 0.5 10*3/uL (ref 0.1–1.0)
Monocytes Relative: 8 %
Neutro Abs: 4.5 10*3/uL (ref 1.7–7.7)
Neutrophils Relative %: 75 %
Platelets: 148 10*3/uL — ABNORMAL LOW (ref 150–400)
RBC: 5.56 MIL/uL (ref 4.22–5.81)
RDW: 12.7 % (ref 11.5–15.5)
WBC: 6 10*3/uL (ref 4.0–10.5)
nRBC: 0 % (ref 0.0–0.2)
nRBC: 0 /100 WBC

## 2019-11-22 LAB — COMPREHENSIVE METABOLIC PANEL
ALT: 45 U/L — ABNORMAL HIGH (ref 0–44)
AST: 76 U/L — ABNORMAL HIGH (ref 15–41)
Albumin: 2.9 g/dL — ABNORMAL LOW (ref 3.5–5.0)
Alkaline Phosphatase: 85 U/L (ref 38–126)
Anion gap: 15 (ref 5–15)
BUN: 42 mg/dL — ABNORMAL HIGH (ref 8–23)
CO2: 25 mmol/L (ref 22–32)
Calcium: 8.6 mg/dL — ABNORMAL LOW (ref 8.9–10.3)
Chloride: 98 mmol/L (ref 98–111)
Creatinine, Ser: 1.48 mg/dL — ABNORMAL HIGH (ref 0.61–1.24)
GFR calc Af Amer: 58 mL/min — ABNORMAL LOW (ref >60)
GFR calc non Af Amer: 50 mL/min — ABNORMAL LOW (ref >60)
Glucose, Bld: 278 mg/dL — ABNORMAL HIGH (ref 70–99)
Potassium: 4.2 mmol/L (ref 3.5–5.1)
Sodium: 138 mmol/L (ref 135–145)
Total Bilirubin: 1.3 mg/dL — ABNORMAL HIGH (ref 0.3–1.2)
Total Protein: 6.9 g/dL (ref 6.5–8.1)

## 2019-11-22 LAB — C-REACTIVE PROTEIN: CRP: 3.6 mg/dL — ABNORMAL HIGH (ref <1.0)

## 2019-11-22 LAB — GLUCOSE, CAPILLARY
Glucose-Capillary: 283 mg/dL — ABNORMAL HIGH (ref 70–99)
Glucose-Capillary: 353 mg/dL — ABNORMAL HIGH (ref 70–99)
Glucose-Capillary: 329 mg/dL — ABNORMAL HIGH (ref 70–99)
Glucose-Capillary: 287 mg/dL — ABNORMAL HIGH (ref 70–99)

## 2019-11-22 LAB — FERRITIN: Ferritin: 4398 ng/mL — ABNORMAL HIGH (ref 24–336)

## 2019-11-22 LAB — D-DIMER, QUANTITATIVE: D-Dimer, Quant: 1.82 ug/mL-FEU — ABNORMAL HIGH (ref 0.00–0.50)

## 2019-11-22 LAB — LACTATE DEHYDROGENASE: LDH: 540 U/L — ABNORMAL HIGH (ref 98–192)

## 2019-11-22 MED ORDER — INSULIN GLARGINE 100 UNIT/ML ~~LOC~~ SOLN
20.0000 [IU] | Freq: Every day | SUBCUTANEOUS | Status: DC
  Administered 2019-11-22: 20 [IU] via SUBCUTANEOUS
  Filled 2019-11-22: qty 0.2

## 2019-11-22 MED ORDER — INSULIN GLARGINE 100 UNIT/ML ~~LOC~~ SOLN
32.0000 [IU] | Freq: Every day | SUBCUTANEOUS | Status: DC
  Administered 2019-11-23 – 2019-11-26 (×4): 32 [IU] via SUBCUTANEOUS
  Filled 2019-11-22 (×4): qty 0.32

## 2019-11-22 MED ORDER — INSULIN ASPART 100 UNIT/ML ~~LOC~~ SOLN
14.0000 [IU] | Freq: Three times a day (TID) | SUBCUTANEOUS | Status: DC
  Administered 2019-11-22 – 2019-11-26 (×13): 14 [IU] via SUBCUTANEOUS

## 2019-11-22 MED ORDER — INSULIN ASPART 100 UNIT/ML ~~LOC~~ SOLN
0.0000 [IU] | Freq: Three times a day (TID) | SUBCUTANEOUS | Status: DC
  Administered 2019-11-23: 4 [IU] via SUBCUTANEOUS
  Administered 2019-11-23 (×2): 11 [IU] via SUBCUTANEOUS
  Administered 2019-11-24: 4 [IU] via SUBCUTANEOUS
  Administered 2019-11-24 (×2): 15 [IU] via SUBCUTANEOUS
  Administered 2019-11-25: 4 [IU] via SUBCUTANEOUS
  Administered 2019-11-25: 20 [IU] via SUBCUTANEOUS
  Administered 2019-11-25: 4 [IU] via SUBCUTANEOUS
  Administered 2019-11-26: 7 [IU] via SUBCUTANEOUS
  Administered 2019-11-26: 3 [IU] via SUBCUTANEOUS

## 2019-11-22 MED ORDER — ALBUTEROL SULFATE HFA 108 (90 BASE) MCG/ACT IN AERS
2.0000 | INHALATION_SPRAY | RESPIRATORY_TRACT | Status: DC | PRN
  Administered 2019-11-23 – 2019-11-26 (×3): 2 via RESPIRATORY_TRACT
  Filled 2019-11-22: qty 6.7

## 2019-11-22 MED ORDER — INSULIN GLARGINE 100 UNIT/ML ~~LOC~~ SOLN: 25.0000 [IU] | Freq: Every day | SUBCUTANEOUS | Status: DC

## 2019-11-22 MED ORDER — INSULIN GLARGINE 100 UNIT/ML ~~LOC~~ SOLN
5.0000 [IU] | Freq: Once | SUBCUTANEOUS | Status: AC
  Administered 2019-11-22: 5 [IU] via SUBCUTANEOUS
  Filled 2019-11-22: qty 0.05

## 2019-11-22 MED ORDER — ATENOLOL-CHLORTHALIDONE 100-25 MG PO TABS: 0.5000 | ORAL_TABLET | Freq: Every day | ORAL | Status: DC

## 2019-11-22 MED ORDER — INSULIN ASPART 100 UNIT/ML ~~LOC~~ SOLN
0.0000 [IU] | Freq: Every day | SUBCUTANEOUS | Status: DC
  Administered 2019-11-22: 3 [IU] via SUBCUTANEOUS
  Administered 2019-11-23: 2 [IU] via SUBCUTANEOUS
  Administered 2019-11-24: 3 [IU] via SUBCUTANEOUS
  Administered 2019-11-25: 4 [IU] via SUBCUTANEOUS

## 2019-11-22 MED ORDER — INSULIN ASPART 100 UNIT/ML ~~LOC~~ SOLN
10.0000 [IU] | Freq: Three times a day (TID) | SUBCUTANEOUS | Status: DC
  Administered 2019-11-22: 10 [IU] via SUBCUTANEOUS

## 2019-11-22 MED ORDER — ADULT MULTIVITAMIN W/MINERALS CH
1.0000 | ORAL_TABLET | Freq: Every day | ORAL | Status: DC
  Administered 2019-11-22 – 2019-11-26 (×5): 1 via ORAL
  Filled 2019-11-22 (×5): qty 1

## 2019-11-22 NOTE — Progress Notes (Signed)
[name]  ZOX:[id number] DOB: 02-18-1956 DOA: 11/20/2019 PCP: Jonathon Jordan, MD    Pt was seen for a brief follow-up visit.   Assessment & Plan:  COVID Pneumonia - acute hypoxic resp failure   Recent Labs  Lab 11/20/19 1626 11/20/19 1707 11/22/19 0500  DDIMER  --   --  1.82*  FERRITIN  --   --  4,398*  CRP  --  7.6* 3.6*  ALT 48*  --  45*  PROCALCITON 0.34  --   --     DM2 uncontrolled w/ hyperglycemia   Transaminitis   Morbid Obesity - Body mass index is 37.23 kg/m.   AKI  Thrombocytopenia  Leukopenia  DVT prophylaxis: lovenox  Code Status: FULL CODE Family Communication:  Disposition Plan:   Consultants:  none  Antimicrobials:  None   Objective: Blood pressure 121/75, pulse 70, temperature 98.3 F (36.8 C), temperature source Oral, resp. rate 16, height 5\' 9"  (1.753 m), weight 114.4 kg, SpO2 94 %.  Intake/Output Summary (Last 24 hours) at 11/22/2019 1629 Last data filed at 11/22/2019 1124 Gross per 24 hour  Intake 361.06 ml  Output 950 ml  Net -588.94 ml   Filed Weights   11/21/19 0000  Weight: 114.4 kg    Examination: Stable at time of follow-up visit.  CBC: Recent Labs  Lab 11/20/19 1626 11/21/19 0500 11/22/19 0500  WBC 4.6 3.0* 6.0  NEUTROABS 2.8 1.4* 4.5  HGB 16.6 16.3 16.2  HCT 48.1 48.6 47.5  MCV 84.1 86.5 85.4  PLT 97* 102* 811*   Basic Metabolic Panel: Recent Labs  Lab 11/20/19 1626 11/21/19 0500 11/22/19 0500  NA 131*  --  138  K 3.7  --  4.2  CL 95*  --  98  CO2 23  --  25  GLUCOSE 294*  --  278*  BUN 28*  --  42*  CREATININE 1.44*  --  1.48*  CALCIUM 8.4*  --  8.6*  MG  --  2.0  --    GFR: Estimated Creatinine Clearance: 65.4 mL/min (A) (by C-G formula based on SCr of 1.48 mg/dL (H)).  Liver Function Tests: Recent Labs  Lab 11/20/19 1626 11/22/19 0500  AST 94* 76*  ALT 48* 45*  ALKPHOS 87 85  BILITOT 1.1 1.3*  PROT 7.3 6.9  ALBUMIN 3.1* 2.9*   No results for input(s): LIPASE, AMYLASE in  the last 168 hours. No results for input(s): AMMONIA in the last 168 hours.  Coagulation Profile: Recent Labs  Lab 11/21/19 0500  INR 1.0    Cardiac Enzymes: No results for input(s): CKTOTAL, CKMB, CKMBINDEX, TROPONINI in the last 168 hours.  HbA1C: Hgb A1c MFr Bld  Date/Time Value Ref Range Status  11/21/2019 05:00 AM 8.1 (H) 4.8 - 5.6 % Final    Comment:    (NOTE) Pre diabetes:          5.7%-6.4% Diabetes:              >6.4% Glycemic control for   <7.0% adults with diabetes   01/03/2011 11:49 AM (H) <5.7 % Final   12.6 (NOTE)  According to the ADA Clinical Practice Recommendations for 2011, when HbA1c is used as a screening test:   >=6.5%   Diagnostic of Diabetes Mellitus           (if abnormal result  is confirmed)  5.7-6.4%   Increased risk of developing Diabetes Mellitus  References:Diagnosis and Classification of Diabetes Mellitus,Diabetes Care,2011,34(Suppl 1):S62-S69 and Standards of Medical Care in         Diabetes - 2011,Diabetes Care,2011,34  (Suppl 1):S11-S61.    CBG: Recent Labs  Lab 11/21/19 1236 11/21/19 1635 11/22/19 0743 11/22/19 1235 11/22/19 1549  GLUCAP 332* 352* 283* 353* 329*    Recent Results (from the past 240 hour(s))  Culture, blood (Routine X 2) w Reflex to ID Panel     Status: None (Preliminary result)   Collection Time: 11/20/19 10:40 PM   Specimen: BLOOD  Result Value Ref Range Status   Specimen Description BLOOD LEFT HAND  Final   Special Requests   Final    BOTTLES DRAWN AEROBIC AND ANAEROBIC Blood Culture results may not be optimal due to an inadequate volume of blood received in culture bottles   Culture   Final    NO GROWTH 2 DAYS Performed at [facility], [address] [place], [place] [address]    Report Status PENDING  Incomplete  Culture, blood (Routine X 2) w Reflex to ID Panel     Status: None (Preliminary result)   Collection Time: 11/20/19  10:45 PM   Specimen: BLOOD  Result Value Ref Range Status   Specimen Description BLOOD RIGHT UPPER ARM  Final   Special Requests   Final    BOTTLES DRAWN AEROBIC AND ANAEROBIC Blood Culture results may not be optimal due to an inadequate volume of blood received in culture bottles   Culture   Final    NO GROWTH 2 DAYS Performed at [facility], [address] [place], [place] [address]    Report Status PENDING  Incomplete     Scheduled Meds: . albuterol  2 puff Inhalation Q8H  . allopurinol  300 mg Oral QAC breakfast  . aspirin EC  81 mg Oral Daily  . atenolol  50 mg Oral Daily  . cholecalciferol  1,000 Units Oral Daily  . dexamethasone (DECADRON) injection  6 mg Intravenous Q24H  . enoxaparin (LOVENOX) injection  40 mg Subcutaneous Q24H  . insulin aspart  0-15 Units Subcutaneous TID WC  . insulin aspart  14 Units Subcutaneous TID WC  . [START ON 11/23/2019] insulin glargine  25 Units Subcutaneous Daily  . ipratropium  2 puff Inhalation Q8H  . mouth rinse  15 mL Mouth Rinse BID  . pantoprazole  40 mg Oral Daily  . [START ON 11/23/2019] rosuvastatin  5 mg Oral Q72H  . vitamin C  500 mg Oral Daily  . zinc sulfate  220 mg Oral Daily   Continuous Infusions: . remdesivir 100 mg in NS 100 mL 100 mg (11/22/19 0907)     LOS: 2 days   Lonia Blood, MD [facility] Office  [phone] Pager - Text Page per Loretha Stapler  If 7PM-7AM, please contact night-coverage per Amion 11/22/2019, 4:29 PM

## 2019-11-22 NOTE — Progress Notes (Signed)
Wife Langley Gauss [phone] updated as, per patient permits at this time. No further questions.

## 2019-11-22 NOTE — Progress Notes (Signed)
Wife updated

## 2019-11-22 NOTE — Progress Notes (Signed)
PROGRESS NOTE  [name] XBJ:[id number] DOB: 02/20/1956 DOA: 11/20/2019 PCP: Mila PalmerWolters, Sharon, MD  HPI/Recap of past 8424 hours: 63 year old male with past medical history significant for hypertension, type 2 diabetes, obesity, who [facility] from home with complaints of shortness of breath and weakness.  His symptoms started at the end of October.  Describes inability to smell with generalized weakness.  Initial Covid test was negative.  Has been increasingly becoming more dyspneic.  His wife tested positive for Covid.  On presentation, hypoxic on ambulation with desaturation in the 80s.  Chest x-ray showed patchy bilateral pulmonary infiltrates.  COVID-19 positive on 11/20/2019.  Admitted for acute COVID-19 viral pneumonia.  11/22/19: Seen and examined.  Reports slight improvement in his breathing.  On 5 L nasal cannula with O2 saturation in the high 80s on the monitor in the room.  Reports persistent dry cough.  Denies nausea, abdominal pain, or diarrhea.  Elevated uncontrolled blood sugars while on Decadron, increased Lantus and NovoLog doses.  [facility] with care.  Assessment/Plan: Active Problems:   COVID-19   Respiratory failure (HCC)   Lactic acidosis   Thrombocytopenia (HCC)   Hyponatremia  Acute COVID-19 viral pneumonia Positive COVID-19 test on 11/20/2019. Independently reviewed chest x-ray admission which showed bilateral patchy infiltrates worse at bases. Continue remdesivir, day number 3 out of 5 Continue Decadron to complete 10 days Continue albuterol inhaler and ipratropium inhaler Continue vitamin C, D3, zinc Continue incentive spirometer Currently O2 saturation 87% on 5 L. Start high flow nasal cannula to maintain O2 saturation greater than 92% Continue to trend inflammatory markers  Acute hypoxic respiratory failure secondary to acute COVID-19 viral pneumonia No oxygen supplementation at baseline Management as per above.   Uncontrolled type 2 diabetes with hyperglycemia Hemoglobin A1c 8.1 Elevated blood sugars exacerbated by Decadron Increased dose of Lantus 25 units daily Increased dose of NovoLog 14 units 3 times daily Continue insulin sliding scale  AKI Baseline creatinine appears to be 1.1 with GFR greater than 60 from records Presented with creatinine of 1.4 with GFR of 52 Creatinine is trending up to 1.48 Continue to avoid nephrotoxins 1.2 L of urine recorded in the last 24 hours Continue daily renal panel  Thrombocytopenia likely in the setting of acute illness versus acute viral infection Platelet count continues to trend up and normalizing.  Resolved leukopenia likely in the setting of acute covid-19 viral infection Continue to monitor with daily CBCs  Elevated AST ALT in the setting of Covid 19 viral infection Continue to closely monitor LFTs while on remdesivir and Crestor He takes Crestor every 72 hours LFTs are trending down.  Obesity BMI 37 Recommend weight loss with healthy dieting and regular physical activity once stable and healed from COVID-19 viral pneumonia.   DVT prophylaxis: Lovenox SQ daily. Code Status: Full code Family Communication:  Will call his wife to give an update.  Disposition Plan:  [facility] care.  Consults called:  None.    Objective: Vitals:   11/21/19 1700 11/21/19 2028 11/21/19 2100 11/22/19 0848  BP:  122/84  125/80  Pulse:  75 73 79  Resp:  (!) 33 (!) 25 15  Temp:  98.2 F (36.8 C)    TempSrc:  Oral    SpO2: (!) 89%  (!) 89% 90%  Weight:      Height:        Intake/Output Summary (Last 24 hours) at 11/22/2019 1106 Last data  filed at 11/22/2019 0735 Gross per 24 hour  Intake 601.06 ml  Output 1550 ml  Net -948.94 ml   Filed Weights   11/21/19 0000  Weight: 114.4 kg    Exam:  . General: 63 y.o. year-old male obese in no acute distress.  Alert and oriented x3.   . Cardiovascular: Regular rate and rhythm no rubs  or gallops.  No JVD or thyromegaly noted.   Marland Kitchen Respiratory: Mild rales diffusely bilaterally no wheezing noted.  . Abdomen: Obese nontender normal bowel sounds present.  . Musculoskeletal: No lower extremity edema.  Palpable pulses in all 4 extremities. Marland Kitchen Psychiatry: Mood is appropriate for condition and setting.   Data Reviewed: CBC: Recent Labs  Lab 11/20/19 1626 11/21/19 0500 11/22/19 0500  WBC 4.6 3.0* 6.0  NEUTROABS 2.8 1.4* 4.5  HGB 16.6 16.3 16.2  HCT 48.1 48.6 47.5  MCV 84.1 86.5 85.4  PLT 97* 102* 237*   Basic Metabolic Panel: Recent Labs  Lab 11/20/19 1626 11/21/19 0500 11/22/19 0500  NA 131*  --  138  K 3.7  --  4.2  CL 95*  --  98  CO2 23  --  25  GLUCOSE 294*  --  278*  BUN 28*  --  42*  CREATININE 1.44*  --  1.48*  CALCIUM 8.4*  --  8.6*  MG  --  2.0  --    GFR: Estimated Creatinine Clearance: 65.4 mL/min (A) (by C-G formula based on SCr of 1.48 mg/dL (H)). Liver Function Tests: Recent Labs  Lab 11/20/19 1626 11/22/19 0500  AST 94* 76*  ALT 48* 45*  ALKPHOS 87 85  BILITOT 1.1 1.3*  PROT 7.3 6.9  ALBUMIN 3.1* 2.9*   No results for input(s): LIPASE, AMYLASE in the last 168 hours. No results for input(s): AMMONIA in the last 168 hours. Coagulation Profile: Recent Labs  Lab 11/21/19 0500  INR 1.0   Cardiac Enzymes: No results for input(s): CKTOTAL, CKMB, CKMBINDEX, TROPONINI in the last 168 hours. BNP (last 3 results) No results for input(s): PROBNP in the last 8760 hours. HbA1C: Recent Labs    11/21/19 0500  HGBA1C 8.1*   CBG: Recent Labs  Lab 11/20/19 2212 11/21/19 0746 11/21/19 1236 11/21/19 1635 11/22/19 0743  GLUCAP 317* 324* 332* 352* 283*   Lipid Profile: No results for input(s): CHOL, HDL, LDLCALC, TRIG, CHOLHDL, LDLDIRECT in the last 72 hours. Thyroid Function Tests: No results for input(s): TSH, T4TOTAL, FREET4, T3FREE, THYROIDAB in the last 72 hours. Anemia Panel: Recent Labs    11/22/19 0500  FERRITIN 4,398*    Urine analysis: No results found for: COLORURINE, APPEARANCEUR, LABSPEC, PHURINE, GLUCOSEU, HGBUR, BILIRUBINUR, KETONESUR, PROTEINUR, UROBILINOGEN, NITRITE, LEUKOCYTESUR Sepsis Labs: @LABRCNTIP (procalcitonin:4,lacticidven:4)  ) Recent Results (from the past 240 hour(s))  Culture, blood (Routine X 2) w Reflex to ID Panel     Status: None (Preliminary result)   Collection Time: 11/20/19 10:40 PM   Specimen: BLOOD  Result Value Ref Range Status   Specimen Description BLOOD LEFT HAND  Final   Special Requests   Final    BOTTLES DRAWN AEROBIC AND ANAEROBIC Blood Culture results may not be optimal due to an inadequate volume of blood received in culture bottles   Culture   Final    NO GROWTH 2 DAYS Performed at [facility], [place] [address], [place], [place] [address]    Report Status PENDING  Incomplete  Culture, blood (Routine X 2) w Reflex to ID Panel  Status: None (Preliminary result)   Collection Time: 11/20/19 10:45 PM   Specimen: BLOOD  Result Value Ref Range Status   Specimen Description BLOOD RIGHT UPPER ARM  Final   Special Requests   Final    BOTTLES DRAWN AEROBIC AND ANAEROBIC Blood Culture results may not be optimal due to an inadequate volume of blood received in culture bottles   Culture   Final    NO GROWTH 2 DAYS Performed at [facility], [address], [place], [place] [address]    Report Status PENDING  Incomplete      Studies: No results found.  Scheduled Meds: . albuterol  2 puff Inhalation Q8H  . allopurinol  300 mg Oral QAC breakfast  . aspirin EC  81 mg Oral Daily  . atenolol  50 mg Oral Daily  . cholecalciferol  1,000 Units Oral Daily  . dexamethasone (DECADRON) injection  6 mg Intravenous Q24H  . enoxaparin (LOVENOX) injection  40 mg Subcutaneous Q24H  . insulin aspart  0-15 Units Subcutaneous TID WC  . insulin aspart  10 Units Subcutaneous TID WC  . insulin glargine  20 Units Subcutaneous Daily  . ipratropium  2 puff Inhalation  Q8H  . mouth rinse  15 mL Mouth Rinse BID  . pantoprazole  40 mg Oral Daily  . [START ON 11/23/2019] rosuvastatin  5 mg Oral Q72H  . vitamin C  500 mg Oral Daily  . zinc sulfate  220 mg Oral Daily    Continuous Infusions: . remdesivir 100 mg in NS 100 mL 100 mg (11/22/19 0907)     LOS: 2 days     Darlin Drop, MD [facility] Pager [phone]  If 7PM-7AM, please contact night-coverage [email or web address] Password [email or web address] 11/22/2019, 11:06 AM

## 2019-11-22 NOTE — Progress Notes (Signed)
Inpatient Diabetes Program Recommendations  AACE/ADA: New Consensus Statement on Inpatient Glycemic Control (2015)  Target Ranges:  Prepandial:   less than 140 mg/dL      Peak postprandial:   less than 180 mg/dL (1-2 hours)      Critically ill patients:  140 - 180 mg/dL   Lab Results  Component Value Date   GLUCAP 283 (H) 11/22/2019   HGBA1C 8.1 (H) 11/21/2019    Review of Glycemic Control Results for [name], [name] (MRN [id number]) as of 11/22/2019 10:56  Ref. Range 11/21/2019 07:46 11/21/2019 12:36 11/21/2019 16:35 11/22/2019 07:43  Glucose-Capillary Latest Ref Range: 70 - 99 mg/dL 324 (H) 332 (H) 352 (H) 283 (H)   Diabetes history: DM 2 Outpatient Diabetes medications: Basaglar 30 units Daily, Novolog 2-10 units tid Current orders for Inpatient glycemic control:  Lantus 20 units Novolog 0-15 units tid  Decadron 6 mg Q24 hours BUN/Creat:  42/1.48 No PO supplementation currently  Inpatient Diabetes Program Recommendations:  Insulin - Basal: Consider increasing Lantus to 28-30 units Insulin- HS Coverage: Add Novolog HS coverage.  Thanks,  Tama Headings RN, MSN, BC-ADM Inpatient Diabetes Coordinator Team Pager [phone] (8a-5p)

## 2019-11-22 NOTE — Progress Notes (Signed)
Patient arrived to [facility] room 141 via carelink. Patient free from pain and VS stable. Patient on 6L oxygen. Bed in lowest position and call bell in reach.

## 2019-11-22 NOTE — Progress Notes (Signed)
Pt prepared for transfer to [place]. Report given to receiving RN. Pt A&Ox4, VS stable, on 5L [place]. IV SL R upper arm. Pt to be transported via [facility].     11/22/19 1335  Vitals  Temp 98.1 F (36.7 C)  Temp Source Oral  BP 127/83  MAP (mmHg) 98  BP Location Right Arm  BP Method Automatic  Patient Position (if appropriate) Sitting  Pulse Rate 67  Pulse Rate Source Monitor  Resp 16  Oxygen Therapy  SpO2 93 %  O2 Device Nasal Cannula  O2 Flow Rate (L/min) 5 L/min  Pain Assessment  Pain Score 0  MEWS Score  MEWS RR 0  MEWS Pulse 0  MEWS Systolic 0  MEWS LOC 0  MEWS Temp 0  MEWS Score 0  MEWS Score Color Nyoka Cowden

## 2019-11-23 DIAGNOSIS — D696 Thrombocytopenia, unspecified: Secondary | ICD-10-CM

## 2019-11-23 LAB — CBC WITH DIFFERENTIAL/PLATELET
Abs Immature Granulocytes: 0 10*3/uL (ref 0.00–0.07)
Basophils Absolute: 0 10*3/uL (ref 0.0–0.1)
Basophils Relative: 0 %
Eosinophils Absolute: 0 10*3/uL (ref 0.0–0.5)
Eosinophils Relative: 0 %
HCT: 47.9 % (ref 39.0–52.0)
Hemoglobin: 16.2 g/dL (ref 13.0–17.0)
Lymphocytes Relative: 18 %
Lymphs Abs: 1.6 10*3/uL (ref 0.7–4.0)
MCH: 29.1 pg (ref 26.0–34.0)
MCHC: 33.8 g/dL (ref 30.0–36.0)
MCV: 86.2 fL (ref 80.0–100.0)
Monocytes Absolute: 0.5 10*3/uL (ref 0.1–1.0)
Monocytes Relative: 6 %
Neutro Abs: 6.8 10*3/uL (ref 1.7–7.7)
Neutrophils Relative %: 76 %
Platelets: 181 10*3/uL (ref 150–400)
RBC: 5.56 MIL/uL (ref 4.22–5.81)
RDW: 13 % (ref 11.5–15.5)
WBC: 9 10*3/uL (ref 4.0–10.5)
nRBC: 0 % (ref 0.0–0.2)

## 2019-11-23 LAB — GLUCOSE, CAPILLARY
Glucose-Capillary: 180 mg/dL — ABNORMAL HIGH (ref 70–99)
Glucose-Capillary: 277 mg/dL — ABNORMAL HIGH (ref 70–99)
Glucose-Capillary: 252 mg/dL — ABNORMAL HIGH (ref 70–99)
Glucose-Capillary: 210 mg/dL — ABNORMAL HIGH (ref 70–99)

## 2019-11-23 LAB — FERRITIN: Ferritin: 3186 ng/mL — ABNORMAL HIGH (ref 24–336)

## 2019-11-23 LAB — D-DIMER, QUANTITATIVE: D-Dimer, Quant: 1.37 ug/mL-FEU — ABNORMAL HIGH (ref 0.00–0.50)

## 2019-11-23 LAB — C-REACTIVE PROTEIN: CRP: 1.8 mg/dL — ABNORMAL HIGH (ref <1.0)

## 2019-11-23 LAB — COMPREHENSIVE METABOLIC PANEL
ALT: 49 U/L — ABNORMAL HIGH (ref 0–44)
AST: 62 U/L — ABNORMAL HIGH (ref 15–41)
Albumin: 3.5 g/dL (ref 3.5–5.0)
Alkaline Phosphatase: 87 U/L (ref 38–126)
Anion gap: 12 (ref 5–15)
BUN: 45 mg/dL — ABNORMAL HIGH (ref 8–23)
CO2: 27 mmol/L (ref 22–32)
Calcium: 8.9 mg/dL (ref 8.9–10.3)
Chloride: 100 mmol/L (ref 98–111)
Creatinine, Ser: 1.18 mg/dL (ref 0.61–1.24)
GFR calc Af Amer: >60 mL/min (ref >60)
GFR calc non Af Amer: >60 mL/min (ref >60)
Glucose, Bld: 147 mg/dL — ABNORMAL HIGH (ref 70–99)
Potassium: 3.7 mmol/L (ref 3.5–5.1)
Sodium: 139 mmol/L (ref 135–145)
Total Bilirubin: 1.2 mg/dL (ref 0.3–1.2)
Total Protein: 7.7 g/dL (ref 6.5–8.1)

## 2019-11-23 MED ORDER — ENOXAPARIN SODIUM 60 MG/0.6ML ~~LOC~~ SOLN
55.0000 mg | SUBCUTANEOUS | Status: DC
  Administered 2019-11-23 – 2019-11-25 (×3): 55 mg via SUBCUTANEOUS
  Filled 2019-11-23 (×3): qty 0.6

## 2019-11-23 MED ORDER — FUROSEMIDE 10 MG/ML IJ SOLN
40.0000 mg | Freq: Every day | INTRAMUSCULAR | Status: DC
  Administered 2019-11-23 – 2019-11-26 (×4): 40 mg via INTRAVENOUS
  Filled 2019-11-23 (×4): qty 4

## 2019-11-23 NOTE — Plan of Care (Signed)
  Problem: Education: Goal: Knowledge of General Education information will improve Description: Including pain rating scale, medication(s)/side effects and non-pharmacologic comfort measures Outcome: Progressing   Problem: Coping: Goal: Level of anxiety will decrease Outcome: Progressing   Problem: Elimination: Goal: Will not experience complications related to bowel motility Outcome: Progressing   Problem: Safety: Goal: Ability to remain free from injury will improve Outcome: Progressing   Problem: Skin Integrity: Goal: Risk for impaired skin integrity will decrease Outcome: Progressing   

## 2019-11-23 NOTE — Progress Notes (Signed)
99- Patient's wife called. Mrs Read stated that the doctor called earlier and updated her on how her husband is doing. Wife able to name a few things the doctor spoke about including for patient go on his abdomen for awhile, oxygen increase, and Lasix.

## 2019-11-23 NOTE — Progress Notes (Signed)
PROGRESS NOTE                                                                                                                                                                                                             Patient Demographics:    [name], is a 63 y.o. male, DOB - [birth year], YQI:[id number]  Outpatient Primary MD for the patient is Rodney Palmer, MD   Admit date - 11/20/2019   LOS - 3  Chief Complaint  Patient presents with  . COVID  . Shortness of Breath  . Fever       Brief Narrative: Patient is a 63 y.o. male with PMHx of DM-2, HTN, obesity-who presented with at least 1 week history of weakness and shortness of breath-he was found to have acute hypoxic respiratory failure in setting of COVID-19 pneumonia.  Significant events: 12/6>> admit to [facility] 12/8>> transferred to [facility]   Subjective:    [name] today feels slightly better-but he remains on 8-10 L of oxygen.   Assessment  & Plan :   Acute Hypoxic Resp Failure due to Covid 19 Viral pneumonia: Remains tenuous on 8-10 L of oxygen.  Continue steroids/remdesivir.  Inflammatory markers are slowly downtrending.  Encouraged prone positioning and incentive spirometry.  Fever: afebrile  O2 requirements:  SpO2: 93 % O2 Flow Rate (L/min): 8 L/min   COVID-19 Labs: Recent Labs    11/20/19 1707 11/22/19 0500 11/23/19 0100  DDIMER  --  1.82* 1.37*  FERRITIN  --  4,398* 3,186*  LDH  --  540*  --   CRP 7.6* 3.6* 1.8*       Component Value Date/Time   BNP 22.5 11/21/2019 0500    Recent Labs  Lab 11/20/19 1626  PROCALCITON 0.34    No results found for: SARSCOV2NAA   COVID-19 Medications: Steroids: 12/6>> Remdesivir: 12/6>>  Other medications: Diuretics:Euvolemic-start 40 mg IV Lasix to maintain negative balance. Antibiotics:Not needed as no evidence of bacterial infection  Prone/Incentive  Spirometry: encouraged patient to lie prone for 3-4 hours at a time for a total of 16 hours a day, and to encourage incentive spirometry use 3-4/hour.  DVT Prophylaxis  :  Lovenox  AKI: Hemodynamically mediated-follow.  Transaminitis: Likely secondary to COVID-19-follow for now-slowly downtrending.  Thrombocytopenia: Secondary to COVID-19-resolved  DM-2: CBGs relatively stable-continue  Lantus 32 units daily, 14 units of premeal NovoLog and SSI.  Follow and adjust.  CBG (last 3)  Recent Labs    11/22/19 2106 11/23/19 0818 11/23/19 1133  GLUCAP 287* 180* 277*   Dyslipidemia: Continue statin  HTN: Controlled- continue atenolol  Obesity: Estimated body mass index is 37.23 kg/m as calculated from the following:   Height as of this encounter: 5\' 9"  (1.753 m).   Weight as of this encounter: 114.4 kg.    Consults  :  None  Procedures  :  None  ABG:    Component Value Date/Time   PHART 7.390 01/03/2011 1232   PCO2ART 36.3 01/03/2011 1232   PO2ART 70.7 (L) 01/03/2011 1232   HCO3 21.5 01/03/2011 1232   TCO2 26 03/13/2013 0754   ACIDBASEDEF 2.4 (H) 01/03/2011 1232   O2SAT 94.0 01/03/2011 1232    Vent Settings: N/A  Condition - Extremely Guarded-  Family Communication  :  Spouse updated over the phone  Code Status :  Full Code  Diet :  Diet Order            Diet Carb Modified Fluid consistency: Thin; Room service appropriate? Yes  Diet effective now               Disposition Plan  :  Remain hospitalized  Barriers to discharge: Hypoxia requiring O2 supplementation/complete 5 days of IV Remdesivir  Antimicorbials  :    Anti-infectives (From admission, onward)   Start     Dose/Rate Route Frequency Ordered Stop   11/21/19 1000  remdesivir 100 mg in sodium chloride 0.9 % 100 mL IVPB     100 mg 200 mL/hr over 30 Minutes Intravenous Daily 11/20/19 1929 11/25/19 0959   11/20/19 2000  remdesivir 200 mg in sodium chloride 0.9% 250 mL IVPB     200 mg 580 mL/hr  over 30 Minutes Intravenous Once 11/20/19 1929 11/20/19 2340      Inpatient Medications  Scheduled Meds: . allopurinol  300 mg Oral QAC breakfast  . aspirin EC  81 mg Oral Daily  . atenolol  50 mg Oral Daily  . cholecalciferol  1,000 Units Oral Daily  . dexamethasone (DECADRON) injection  6 mg Intravenous Q24H  . enoxaparin (LOVENOX) injection  55 mg Subcutaneous Q24H  . insulin aspart  0-20 Units Subcutaneous TID WC  . insulin aspart  0-5 Units Subcutaneous QHS  . insulin aspart  14 Units Subcutaneous TID WC  . insulin glargine  32 Units Subcutaneous Daily  . mouth rinse  15 mL Mouth Rinse BID  . multivitamin with minerals  1 tablet Oral Daily  . pantoprazole  40 mg Oral Daily  . rosuvastatin  5 mg Oral Q72H  . vitamin C  500 mg Oral Daily  . zinc sulfate  220 mg Oral Daily   Continuous Infusions: . remdesivir 100 mg in NS 100 mL Stopped (11/23/19 1121)   PRN Meds:.albuterol, loratadine, pneumococcal 23 valent vaccine   Time Spent in minutes  35   The patient is critically ill with multiple organ system failure and requires high complexity decision making for assessment and support, frequent evaluation and titration of therapies, advanced monitoring, review of radiographic studies and interpretation of complex data.   See all Orders from today for further details   Oren Binet M.D on 11/23/2019 at 3:09 PM  To page go to [email or web address] - use universal password  [facility] -  Office  [phone]    Objective:   Vitals:  11/23/19 0500 11/23/19 0814 11/23/19 1036 11/23/19 1150  BP: 111/80 119/81 119/81 117/77  Pulse:  62 62 76  Resp: (!) 23 (!) 23  (!) 24  Temp: 97.6 F (36.4 C) 97.6 F (36.4 C)  97.7 F (36.5 C)  TempSrc: Oral Oral  Oral  SpO2: 93% 92%  93%  Weight:      Height:        Wt Readings from Last 3 Encounters:  11/21/19 114.4 kg  04/06/13 115.2 kg  03/28/13 115.2 kg     Intake/Output Summary (Last 24 hours) at 11/23/2019 1509  Last data filed at 11/23/2019 1300 Gross per 24 hour  Intake 1973.39 ml  Output 1920 ml  Net 53.39 ml     Physical Exam Gen Exam:Alert awake-not in any distress HEENT:atraumatic, normocephalic Chest: B/L clear to auscultation anteriorly CVS:S1S2 regular Abdomen:soft non tender, non distended Extremities:no edema Neurology: Non focal Skin: no rash   Data Review:    CBC Recent Labs  Lab 11/20/19 1626 11/21/19 0500 11/22/19 0500 11/23/19 0100  WBC 4.6 3.0* 6.0 9.0  HGB 16.6 16.3 16.2 16.2  HCT 48.1 48.6 47.5 47.9  PLT 97* 102* 148* 181  MCV 84.1 86.5 85.4 86.2  MCH 29.0 29.0 29.1 29.1  MCHC 34.5 33.5 34.1 33.8  RDW 12.8 13.0 12.7 13.0  LYMPHSABS 1.3 1.2 1.0 1.6  MONOABS 0.5 0.4 0.5 0.5  EOSABS 0.0 0.0 0.0 0.0  BASOSABS 0.0 0.0 0.0 0.0    Chemistries  Recent Labs  Lab 11/20/19 1626 11/21/19 0500 11/22/19 0500 11/23/19 0100  NA 131*  --  138 139  K 3.7  --  4.2 3.7  CL 95*  --  98 100  CO2 23  --  25 27  GLUCOSE 294*  --  278* 147*  BUN 28*  --  42* 45*  CREATININE 1.44*  --  1.48* 1.18  CALCIUM 8.4*  --  8.6* 8.9  MG  --  2.0  --   --   AST 94*  --  76* 62*  ALT 48*  --  45* 49*  ALKPHOS 87  --  85 87  BILITOT 1.1  --  1.3* 1.2   ------------------------------------------------------------------------------------------------------------------ No results for input(s): CHOL, HDL, LDLCALC, TRIG, CHOLHDL, LDLDIRECT in the last 72 hours.  Lab Results  Component Value Date   HGBA1C 8.1 (H) 11/21/2019   ------------------------------------------------------------------------------------------------------------------ No results for input(s): TSH, T4TOTAL, T3FREE, THYROIDAB in the last 72 hours.  Invalid input(s): FREET3 ------------------------------------------------------------------------------------------------------------------ Recent Labs    11/22/19 0500 11/23/19 0100  FERRITIN 4,398* 3,186*    Coagulation profile Recent Labs  Lab  11/21/19 0500  INR 1.0    Recent Labs    11/22/19 0500 11/23/19 0100  DDIMER 1.82* 1.37*    Cardiac Enzymes No results for input(s): CKMB, TROPONINI, MYOGLOBIN in the last 168 hours.  Invalid input(s): CK ------------------------------------------------------------------------------------------------------------------    Component Value Date/Time   BNP 22.5 11/21/2019 0500    Micro Results Recent Results (from the past 240 hour(s))  Culture, blood (Routine X 2) w Reflex to ID Panel     Status: None (Preliminary result)   Collection Time: 11/20/19 10:40 PM   Specimen: BLOOD  Result Value Ref Range Status   Specimen Description BLOOD LEFT HAND  Final   Special Requests   Final    BOTTLES DRAWN AEROBIC AND ANAEROBIC Blood Culture results may not be optimal due to an inadequate volume of blood received in culture bottles   Culture  Final    NO GROWTH 3 DAYS Performed at [facility], [address] [place], [place] [address]    Report Status PENDING  Incomplete  Culture, blood (Routine X 2) w Reflex to ID Panel     Status: None (Preliminary result)   Collection Time: 11/20/19 10:45 PM   Specimen: BLOOD  Result Value Ref Range Status   Specimen Description BLOOD RIGHT UPPER ARM  Final   Special Requests   Final    BOTTLES DRAWN AEROBIC AND ANAEROBIC Blood Culture results may not be optimal due to an inadequate volume of blood received in culture bottles   Culture   Final    NO GROWTH 3 DAYS Performed at [facility], [address] [place], [place] [address]    Report Status PENDING  Incomplete    Radiology Reports Dg Chest Portable 1 View  Result Date: 11/20/2019 CLINICAL DATA:  Shortness of breath, COVID positive EXAM: PORTABLE CHEST 1 VIEW COMPARISON:  None. FINDINGS: The heart size is mildly enlarged. The lung volumes are low. Patchy bilateral interstitial and airspace opacities are noted. There is no pleural effusion or pneumothorax. The visualized  skeletal structures are unremarkable. IMPRESSION: 1. Patchy bilateral interstitial and airspace opacities likely reflect COVID-19 pneumonia. Electronically Signed   By: Romona Curlsyler  Litton M.D.   On: 11/20/2019 17:22

## 2019-11-23 NOTE — Progress Notes (Signed)
Called Patient's wife Langley Gauss and updated her to his current condition.

## 2019-11-23 NOTE — Progress Notes (Signed)
Pt family member dropped off belongings. Bag contained underwear, pajama pants and hand sanitizer

## 2019-11-23 NOTE — Progress Notes (Signed)
Ok to increase lovenox to 0.5mg /kg/day due to wt per Dr. Nena Alexander. Plt is now wnl.  Onnie Boer, PharmD, BCIDP, AAHIVP, CPP Infectious Disease Pharmacist 11/23/2019 1:25 PM

## 2019-11-24 LAB — CBC WITH DIFFERENTIAL/PLATELET
Abs Immature Granulocytes: 0.04 10*3/uL (ref 0.00–0.07)
Basophils Absolute: 0.1 10*3/uL (ref 0.0–0.1)
Basophils Relative: 1 %
Eosinophils Absolute: 0 10*3/uL (ref 0.0–0.5)
Eosinophils Relative: 0 %
HCT: 46.8 % (ref 39.0–52.0)
Hemoglobin: 15.6 g/dL (ref 13.0–17.0)
Immature Granulocytes: 0 %
Lymphocytes Relative: 15 %
Lymphs Abs: 1.4 10*3/uL (ref 0.7–4.0)
MCH: 28.8 pg (ref 26.0–34.0)
MCHC: 33.3 g/dL (ref 30.0–36.0)
MCV: 86.5 fL (ref 80.0–100.0)
Monocytes Absolute: 1.4 10*3/uL — ABNORMAL HIGH (ref 0.1–1.0)
Monocytes Relative: 15 %
Neutro Abs: 6.5 10*3/uL (ref 1.7–7.7)
Neutrophils Relative %: 69 %
Platelets: 186 10*3/uL (ref 150–400)
RBC: 5.41 MIL/uL (ref 4.22–5.81)
RDW: 12.9 % (ref 11.5–15.5)
WBC: 9.5 10*3/uL (ref 4.0–10.5)
nRBC: 0 % (ref 0.0–0.2)

## 2019-11-24 LAB — COMPREHENSIVE METABOLIC PANEL
ALT: 57 U/L — ABNORMAL HIGH (ref 0–44)
AST: 69 U/L — ABNORMAL HIGH (ref 15–41)
Albumin: 3.2 g/dL — ABNORMAL LOW (ref 3.5–5.0)
Alkaline Phosphatase: 82 U/L (ref 38–126)
Anion gap: 13 (ref 5–15)
BUN: 41 mg/dL — ABNORMAL HIGH (ref 8–23)
CO2: 27 mmol/L (ref 22–32)
Calcium: 8.7 mg/dL — ABNORMAL LOW (ref 8.9–10.3)
Chloride: 97 mmol/L — ABNORMAL LOW (ref 98–111)
Creatinine, Ser: 1.32 mg/dL — ABNORMAL HIGH (ref 0.61–1.24)
GFR calc Af Amer: >60 mL/min (ref >60)
GFR calc non Af Amer: 58 mL/min — ABNORMAL LOW (ref >60)
Glucose, Bld: 150 mg/dL — ABNORMAL HIGH (ref 70–99)
Potassium: 3.9 mmol/L (ref 3.5–5.1)
Sodium: 137 mmol/L (ref 135–145)
Total Bilirubin: 1.2 mg/dL (ref 0.3–1.2)
Total Protein: 7 g/dL (ref 6.5–8.1)

## 2019-11-24 LAB — D-DIMER, QUANTITATIVE: D-Dimer, Quant: 0.71 ug/mL-FEU — ABNORMAL HIGH (ref 0.00–0.50)

## 2019-11-24 LAB — GLUCOSE, CAPILLARY
Glucose-Capillary: 385 mg/dL — ABNORMAL HIGH (ref 70–99)
Glucose-Capillary: 320 mg/dL — ABNORMAL HIGH (ref 70–99)
Glucose-Capillary: 259 mg/dL — ABNORMAL HIGH (ref 70–99)

## 2019-11-24 LAB — FERRITIN: Ferritin: 2032 ng/mL — ABNORMAL HIGH (ref 24–336)

## 2019-11-24 LAB — C-REACTIVE PROTEIN: CRP: 0.9 mg/dL (ref <1.0)

## 2019-11-24 MED ORDER — DIPHENHYDRAMINE HCL 25 MG PO CAPS
25.0000 mg | ORAL_CAPSULE | Freq: Four times a day (QID) | ORAL | Status: DC | PRN
  Administered 2019-11-24: 25 mg via ORAL
  Filled 2019-11-24: qty 1

## 2019-11-24 NOTE — Progress Notes (Signed)
PROGRESS NOTE                                                                                                                                                                                                             Patient Demographics:    [name], is a 63 y.o. male, DOB - 11/17/1956, XBJ:[id number]  Outpatient Primary MD for the patient is [name], MD   Admit date - 11/20/2019   LOS - 4  Chief Complaint  Patient presents with  . COVID  . Shortness of Breath  . Fever       Brief Narrative: Patient is a 63 y.o. male with PMHx of DM-2, HTN, obesity-who presented with at least 1 week history of weakness and shortness of breath-he was found to have acute hypoxic respiratory failure in setting of COVID-19 pneumonia.  Significant events: 12/6>> admit to [facility] 12/8>> transferred to [facility]   Subjective:    [name] today feels better than yesterday-remains on around 8 L of oxygen.  He works night shift-and is requesting a sleeping aid.   Assessment  & Plan :   Acute Hypoxic Resp Failure due to Covid 19 Viral pneumonia: Feels better-although remains on 8 L of oxygen.  Continue steroids/remdesivir.  Inflammatory markers appear to be slowly trending down.  Continued attempts to slowly titrate down FiO2 as much as possible.   Fever: afebrile  O2 requirements:  SpO2: 95 % O2 Flow Rate (L/min): 8 L/min   COVID-19 Labs: Recent Labs    11/22/19 0500 11/23/19 0100 11/24/19 0100 11/24/19 0115  DDIMER 1.82* 1.37*  --  0.71*  FERRITIN 4,398* 3,186* 2,032*  --   LDH 540*  --   --   --   CRP 3.6* 1.8* 0.9  --        Component Value Date/Time   BNP 22.5 11/21/2019 0500    Recent Labs  Lab 11/20/19 1626  PROCALCITON 0.34    No results found for: SARSCOV2NAA   COVID-19 Medications: Steroids: 12/6>> Remdesivir: 12/6>>  Other medications:  Diuretics:Euvolemic-continue intravenous Lasix to maintain negative balance.  Follow weights/electrolytes/volume status. Antibiotics:Not needed as no evidence of bacterial infection  Prone/Incentive Spirometry: encouraged patient to lie prone for 3-4 hours at a time for a total of 16 hours a day, and to encourage incentive spirometry  use 3-4/hour.  DVT Prophylaxis  :  Lovenox  AKI: Hemodynamically mediated-follow.  Transaminitis: Likely secondary to COVID-19-follow for now-slowly downtrending.  Thrombocytopenia: Secondary to COVID-19-resolved  DM-2: CBGs relatively stable-continue Lantus 32 units daily, 14 units of premeal NovoLog and SSI.  Follow and adjust.  CBG (last 3)  Recent Labs    11/23/19 1133 11/23/19 1608 11/23/19 2037  GLUCAP 277* 252* 210*   Dyslipidemia: Continue statin  HTN: Controlled- continue atenolol  Obesity: Estimated body mass index is 37.23 kg/m as calculated from the following:   Height as of this encounter:  (1.753 m).   Weight as of this encounter: 114.4 kg.    Consults  :  None  Procedures  :  None  ABG:    Component Value Date/Time   PHART 7.390 01/03/2011 1232   PCO2ART 36.3 01/03/2011 1232   PO2ART 70.7 (L) 01/03/2011 1232   HCO3 21.5 01/03/2011 1232   TCO2 26 03/13/2013 0754   ACIDBASEDEF 2.4 (H) 01/03/2011 1232   O2SAT 94.0 01/03/2011 1232    Vent Settings: N/A  Condition - Extremely Guarded  Family Communication  :  Spouse updated over the phone on 12/10  Code Status :  Full Code  Diet :  Diet Order            Diet Carb Modified Fluid consistency: Thin; Room service appropriate? Yes  Diet effective now               Disposition Plan  :  Remain hospitalized  Barriers to discharge: Hypoxia requiring O2 supplementation/complete 5 days of IV Remdesivir  Antimicorbials  :    Anti-infectives (From admission, onward)   Start     Dose/Rate Route Frequency Ordered Stop   11/21/19 1000  remdesivir 100 mg in sodium  chloride 0.9 % 100 mL IVPB     100 mg 200 mL/hr over 30 Minutes Intravenous Daily 11/20/19 1929 11/24/19 1017   11/20/19 2000  remdesivir 200 mg in sodium chloride 0.9% 250 mL IVPB     200 mg 580 mL/hr over 30 Minutes Intravenous Once 11/20/19 1929 11/20/19 2340      Inpatient Medications  Scheduled Meds: . allopurinol  300 mg Oral QAC breakfast  . aspirin EC  81 mg Oral Daily  . atenolol  50 mg Oral Daily  . cholecalciferol  1,000 Units Oral Daily  . dexamethasone (DECADRON) injection  6 mg Intravenous Q24H  . enoxaparin (LOVENOX) injection  55 mg Subcutaneous Q24H  . furosemide  40 mg Intravenous Daily  . insulin aspart  0-20 Units Subcutaneous TID WC  . insulin aspart  0-5 Units Subcutaneous QHS  . insulin aspart  14 Units Subcutaneous TID WC  . insulin glargine  32 Units Subcutaneous Daily  . mouth rinse  15 mL Mouth Rinse BID  . multivitamin with minerals  1 tablet Oral Daily  . pantoprazole  40 mg Oral Daily  . rosuvastatin  5 mg Oral Q72H  . vitamin C  500 mg Oral Daily  . zinc sulfate  220 mg Oral Daily   Continuous Infusions:  PRN Meds:.albuterol, loratadine, pneumococcal 23 valent vaccine   Time Spent in minutes  35   See all Orders from today for further details   Jeoffrey Massed M.D on 11/24/2019 at 10:44 AM  To page go to [email or web address] - use universal password  [facility] -  Office  [phone]    Objective:   Vitals:   11/24/19 0500 11/24/19 0600 11/24/19 0757 11/24/19  0931  BP:   131/86 131/86  Pulse: 69 76 80 80  Resp: 19 (!) 21 16   Temp:   97.6 F (36.4 C)   TempSrc:   Oral   SpO2: 96% (!) 89% 95%   Weight:      Height:        Wt Readings from Last 3 Encounters:  11/21/19 114.4 kg  04/06/13 115.2 kg  03/28/13 115.2 kg     Intake/Output Summary (Last 24 hours) at 11/24/2019 1044 Last data filed at 11/24/2019 0800 Gross per 24 hour  Intake 1251.39 ml  Output 3620 ml  Net -2368.61 ml     Physical Exam Gen Exam:Alert  awake-not in any distress HEENT:atraumatic, normocephalic Chest: B/L clear to auscultation anteriorly CVS:S1S2 regular Abdomen:soft non tender, non distended Extremities:no edema Neurology: Non focal Skin: no rash   Data Review:    CBC Recent Labs  Lab 11/20/19 1626 11/21/19 0500 11/22/19 0500 11/23/19 0100 11/24/19 0115  WBC 4.6 3.0* 6.0 9.0 9.5  HGB 16.6 16.3 16.2 16.2 15.6  HCT 48.1 48.6 47.5 47.9 46.8  PLT 97* 102* 148* 181 186  MCV 84.1 86.5 85.4 86.2 86.5  MCH 29.0 29.0 29.1 29.1 28.8  MCHC 34.5 33.5 34.1 33.8 33.3  RDW 12.8 13.0 12.7 13.0 12.9  LYMPHSABS 1.3 1.2 1.0 1.6 1.4  MONOABS 0.5 0.4 0.5 0.5 1.4*  EOSABS 0.0 0.0 0.0 0.0 0.0  BASOSABS 0.0 0.0 0.0 0.0 0.1    Chemistries  Recent Labs  Lab 11/20/19 1626 11/21/19 0500 11/22/19 0500 11/23/19 0100 11/24/19 0115  NA 131*  --  138 139 137  K 3.7  --  4.2 3.7 3.9  CL 95*  --  98 100 97*  CO2 23  --  25 27 27   GLUCOSE 294*  --  278* 147* 150*  BUN 28*  --  42* 45* 41*  CREATININE 1.44*  --  1.48* 1.18 1.32*  CALCIUM 8.4*  --  8.6* 8.9 8.7*  MG  --  2.0  --   --   --   AST 94*  --  76* 62* 69*  ALT 48*  --  45* 49* 57*  ALKPHOS 87  --  85 87 82  BILITOT 1.1  --  1.3* 1.2 1.2   ------------------------------------------------------------------------------------------------------------------ No results for input(s): CHOL, HDL, LDLCALC, TRIG, CHOLHDL, LDLDIRECT in the last 72 hours.  Lab Results  Component Value Date   HGBA1C 8.1 (H) 11/21/2019   ------------------------------------------------------------------------------------------------------------------ No results for input(s): TSH, T4TOTAL, T3FREE, THYROIDAB in the last 72 hours.  Invalid input(s): FREET3 ------------------------------------------------------------------------------------------------------------------ Recent Labs    11/23/19 0100 11/24/19 0100  FERRITIN 3,186* 2,032*    Coagulation profile Recent Labs  Lab 11/21/19  0500  INR 1.0    Recent Labs    11/23/19 0100 11/24/19 0115  DDIMER 1.37* 0.71*    Cardiac Enzymes No results for input(s): CKMB, TROPONINI, MYOGLOBIN in the last 168 hours.  Invalid input(s): CK ------------------------------------------------------------------------------------------------------------------    Component Value Date/Time   BNP 22.5 11/21/2019 0500    Micro Results Recent Results (from the past 240 hour(s))  Culture, blood (Routine X 2) w Reflex to ID Panel     Status: None (Preliminary result)   Collection Time: 11/20/19 10:40 PM   Specimen: BLOOD  Result Value Ref Range Status   Specimen Description BLOOD LEFT HAND  Final   Special Requests   Final    BOTTLES DRAWN AEROBIC AND ANAEROBIC Blood Culture results may not  be optimal due to an inadequate volume of blood received in culture bottles   Culture   Final    NO GROWTH 4 DAYS Performed at [facility], [address] [place], [place] [address]    Report Status PENDING  Incomplete  Culture, blood (Routine X 2) w Reflex to ID Panel     Status: None (Preliminary result)   Collection Time: 11/20/19 10:45 PM   Specimen: BLOOD  Result Value Ref Range Status   Specimen Description BLOOD RIGHT UPPER ARM  Final   Special Requests   Final    BOTTLES DRAWN AEROBIC AND ANAEROBIC Blood Culture results may not be optimal due to an inadequate volume of blood received in culture bottles   Culture   Final    NO GROWTH 4 DAYS Performed at [facility], [address] [place], [place] [address]    Report Status PENDING  Incomplete    Radiology Reports DG Chest Portable 1 View  Result Date: 11/20/2019 CLINICAL DATA:  Shortness of breath, COVID positive EXAM: PORTABLE CHEST 1 VIEW COMPARISON:  None. FINDINGS: The heart size is mildly enlarged. The lung volumes are low. Patchy bilateral interstitial and airspace opacities are noted. There is no pleural effusion or pneumothorax. The visualized skeletal  structures are unremarkable. IMPRESSION: 1. Patchy bilateral interstitial and airspace opacities likely reflect COVID-19 pneumonia. Electronically Signed   By: Romona Curlsyler  Litton M.D.   On: 11/20/2019 17:22

## 2019-11-24 NOTE — Progress Notes (Signed)
Inpatient Diabetes Program Recommendations  AACE/ADA: New Consensus Statement on Inpatient Glycemic Control (2015)  Target Ranges:  Prepandial:   less than 140 mg/dL      Peak postprandial:   less than 180 mg/dL (1-2 hours)      Critically ill patients:  140 - 180 mg/dL   Lab Results  Component Value Date   GLUCAP 385 (H) 11/24/2019   HGBA1C 8.1 (H) 11/21/2019    Review of Glycemic Control Results for [name], [name] (MRN [id number]) as of 11/24/2019 12:36  Ref. Range 11/23/2019 11:33 11/23/2019 16:08 11/23/2019 20:37 11/24/2019 11:38  Glucose-Capillary Latest Ref Range: 70 - 99 mg/dL 277 (H) 252 (H) 210 (H) 385 (H)   Diabetes history: DM 2 Outpatient Diabetes medications:  Basalgar 30 units daily, Novolog 2-10 units tid with meals Current orders for Inpatient glycemic control:  Decadron 6 mg IV daily, Novolog resistant tid & hs Lantus 32 units daily, Novolog 14 units tid with meals  Inpatient Diabetes Program Recommendations:   Consider increasing lantus to 40 units qd.  Thanks, Bronson Curb, MSN, RNC-OB Diabetes Coordinator [phone] (8a-5p)

## 2019-11-24 NOTE — Progress Notes (Signed)
RN called pt.'s wife for daily updated. Questions answered and emotional support given. No further concerns.

## 2019-11-24 NOTE — Evaluation (Signed)
Occupational Therapy Evaluation Patient Details Name: [name] MRN: [id number] DOB: 08-11-1956 Today's Date: 11/24/2019    History of Present Illness Patient is a 63 y.o. male with PMHx of DM-2, HTN, obesity-who presented with at least 1 week history of weakness and shortness of breath-he was found to have acute hypoxic respiratory failure in setting of COVID-19 pneumonia.   Clinical Impression   Prior to admission, Pt independent in ADL/IADL and mobility. Pt works full time as a Curator. Today he presented on 10L via HFNC SpO2 was 94, reduced to 8L and Pt continued saturations >90%. Pt able to perform all UB and LB ADL including sink level grooming, no noticeable drop in SpO2 on 8L and assist for lines only. Pt reports having gotten in the shower and bathing himself this morning. Verbally educated on energy conservation strategies. No further OT needs at this time - OT to sign off.     Follow Up Recommendations  No OT follow up    Equipment Recommendations  None recommended by OT    Recommendations for Other Services       Precautions / Restrictions Restrictions Weight Bearing Restrictions: No      Mobility Bed Mobility               General bed mobility comments: OOB in recliner at beginning and end of session  Transfers Overall transfer level: Needs assistance Equipment used: None Transfers: Sit to/from Stand Sit to Stand: Supervision         General transfer comment: supervision for line management only    Balance Overall balance assessment: No apparent balance deficits (not formally assessed)                                         ADL either performed or assessed with clinical judgement   ADL Overall ADL's : At baseline                                       General ADL Comments: supervision for line management only - able to perform sink level grooming, LB dressing, showered independently earlier  today with RN     Vision Baseline Vision/History: Wears glasses Wears Glasses: Reading only Patient Visual Report: No change from baseline Vision Assessment?: No apparent visual deficits     Perception     Praxis      Pertinent Vitals/Pain Pain Assessment: No/denies pain     Hand Dominance Right   Extremity/Trunk Assessment Upper Extremity Assessment Upper Extremity Assessment: Overall WFL for tasks assessed   Lower Extremity Assessment Lower Extremity Assessment: Defer to PT evaluation   Cervical / Trunk Assessment Cervical / Trunk Assessment: Normal   Communication Communication Communication: No difficulties   Cognition Arousal/Alertness: Awake/alert Behavior During Therapy: WFL for tasks assessed/performed Overall Cognitive Status: Within Functional Limits for tasks assessed                                     General Comments  Pt on 10L via HFNC when OT entered, SpO2 at 94, Pt able to perform functional activities at sink level in bathroom, ambulation on 8L and maintain saturations >90.     Exercises     Shoulder Instructions  Home Living Family/patient expects to be discharged to:: Private residence Living Arrangements: Spouse/significant other Available Help at Discharge: Family;Available 24 hours/day Type of Home: House Home Access: Level entry     Home Layout: One level;Bed/bath upstairs;1/2 bath on main level     Bathroom Shower/Tub: Chief Strategy Officer: Standard     Home Equipment: None          Prior Functioning/Environment Level of Independence: Independent        Comments: Corporate treasurer        OT Problem List: Decreased activity tolerance;Cardiopulmonary status limiting activity      OT Treatment/Interventions:      OT Goals(Current goals can be found in the care plan section) Acute Rehab OT Goals Patient Stated Goal: to breathe better and get home OT Goal Formulation: With  patient Time For Goal Achievement: 12/08/19 Potential to Achieve Goals: Good  OT Frequency:     Barriers to D/C:            Co-evaluation              AM-PAC OT "6 Clicks" Daily Activity     Outcome Measure Help from another person eating meals?: None Help from another person taking care of personal grooming?: None Help from another person toileting, which includes using toliet, bedpan, or urinal?: A Little Help from another person bathing (including washing, rinsing, drying)?: None Help from another person to put on and taking off regular upper body clothing?: None Help from another person to put on and taking off regular lower body clothing?: None 6 Click Score: 23   End of Session Equipment Utilized During Treatment: Oxygen(10-8L via HFNC) Nurse Communication: Mobility status  Activity Tolerance: Patient tolerated treatment well Patient left: in chair;with call bell/phone within reach  OT Visit Diagnosis: Muscle weakness (generalized) (M62.81)                Time: 1610-9604 OT Time Calculation (min): 29 min Charges:  OT General Charges $OT Visit: 1 Visit OT Evaluation $OT Eval Low Complexity: 1 Low OT Treatments $Self Care/Home Management : 8-22 mins  Sherryl Manges OTR/L Acute Rehabilitation Services Pager: [phone] Office: [phone]  Evern Bio Tonatiuh Mallon 11/24/2019, 12:11 PM

## 2019-11-24 NOTE — Evaluation (Signed)
Physical Therapy Evaluation & Discharge Patient Details Name: [name] MRN: [id number] DOB: 1956-06-16 Today's Date: 11/24/2019   History of Present Illness  Pt is a 63 y.o. male admitted 11/20/19 with acute hypoxic respiratory failure in setting of COVID-19 PNA. PMH includes DM2, HTN, obesity.    Clinical Impression  Patient evaluated by Physical Therapy with no further acute PT needs identified. 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 Today, pt independent with mobility and ADL tasks. SpO2 down to 87% on 5L O2 with mobility. Educ re: importance of mobility, seated/standing therex, energy conservation, return to activity recommendations. All education has been completed and the patient has no further questions. Acute PT is signing off. Thank you for this referral.    Follow Up Recommendations No PT follow up    Equipment Recommendations  None recommended by PT    Recommendations for Other Services       Precautions / Restrictions Precautions Precautions: None Restrictions Weight Bearing Restrictions: No      Mobility  Bed Mobility               General bed mobility comments: Received sitting in recliner  Transfers Overall transfer level: Independent Equipment used: None                Ambulation/Gait Ambulation/Gait assistance: Independent Gait Distance (Feet): 600 Feet Assistive device: None Gait Pattern/deviations: Step-through pattern;Decreased stride length Gait velocity: Decreased Gait velocity interpretation: 1.31 - 2.62 ft/sec, indicative of limited community ambulator General Gait Details: Slow, cautious gait initially in room with 1x seated rest break, then hallway ambulation with no rest breaks; SpO2 down to 87% on 5L O2 , returning to 91% with seated rest  Stairs            Wheelchair Mobility    Modified Rankin (Stroke Patients Only)       Balance Overall balance assessment: No apparent  balance deficits (not formally assessed)                                           Pertinent Vitals/Pain Pain Assessment: No/denies pain    Home Living Family/patient expects to be discharged to:: Private residence Living Arrangements: Spouse/significant other Available Help at Discharge: Family;Available 24 hours/day Type of Home: House Home Access: Level entry     Home Layout: Bed/bath upstairs;1/2 bath on main level;Two level Home Equipment: None      Prior Function Level of Independence: Independent         Comments: Works as Systems analyst   Dominant Hand: Right    Extremity/Trunk Assessment   Upper Extremity Assessment Upper Extremity Assessment: Overall WFL for tasks assessed    Lower Extremity Assessment Lower Extremity Assessment: Overall WFL for tasks assessed    Cervical / Trunk Assessment Cervical / Trunk Assessment: Normal  Communication   Communication: No difficulties  Cognition Arousal/Alertness: Awake/alert Behavior During Therapy: WFL for tasks assessed/performed Overall Cognitive Status: Within Functional Limits for tasks assessed                                        General Comments      Exercises Other Exercises Other Exercises: Repeated sit<>stand, marching in place, calf raises and partial squats  at sink (all within O2/lines limits) Other Exercises: Deep breathing; energy conservation handout given (OT educated, PT reinforced)   Assessment/Plan    PT Assessment Patent does not need any further PT services  PT Problem List         PT Treatment Interventions      PT Goals (Current goals can be found in the Care Plan section)  Acute Rehab PT Goals PT Goal Formulation: All assessment and education complete, DC therapy    Frequency     Barriers to discharge        Co-evaluation               AM-PAC PT "6 Clicks" Mobility  Outcome Measure Help needed  turning from your back to your side while in a flat bed without using bedrails?: None Help needed moving from lying on your back to sitting on the side of a flat bed without using bedrails?: None Help needed moving to and from a bed to a chair (including a wheelchair)?: None Help needed standing up from a chair using your arms (e.g., wheelchair or bedside chair)?: None Help needed to walk in hospital room?: None Help needed climbing 3-5 steps with a railing? : None 6 Click Score: 24    End of Session Equipment Utilized During Treatment: Oxygen Activity Tolerance: Patient tolerated treatment well Patient left: in chair;with call bell/phone within reach Nurse Communication: Mobility status PT Visit Diagnosis: Other abnormalities of gait and mobility (R26.89)    Time: 9242-6834 PT Time Calculation (min) (ACUTE ONLY): 24 min   Charges:   PT Evaluation $PT Eval Moderate Complexity: 1 Mod PT Treatments $Therapeutic Exercise: 8-22 mins      Mabeline Caras, PT, DPT Acute Rehabilitation Services  Pager [phone] Office [place] 11/24/2019, 4:16 PM

## 2019-11-25 DIAGNOSIS — E871 Hypo-osmolality and hyponatremia: Secondary | ICD-10-CM

## 2019-11-25 LAB — CULTURE, BLOOD (ROUTINE X 2)
Culture: NO GROWTH
Culture: NO GROWTH

## 2019-11-25 LAB — CBC WITH DIFFERENTIAL/PLATELET
Abs Immature Granulocytes: 0.11 10*3/uL — ABNORMAL HIGH (ref 0.00–0.07)
Basophils Absolute: 0.1 10*3/uL (ref 0.0–0.1)
Basophils Relative: 0 %
Eosinophils Absolute: 0 10*3/uL (ref 0.0–0.5)
Eosinophils Relative: 0 %
HCT: 48.7 % (ref 39.0–52.0)
Hemoglobin: 16.5 g/dL (ref 13.0–17.0)
Immature Granulocytes: 1 %
Lymphocytes Relative: 15 %
Lymphs Abs: 1.9 10*3/uL (ref 0.7–4.0)
MCH: 28.6 pg (ref 26.0–34.0)
MCHC: 33.9 g/dL (ref 30.0–36.0)
MCV: 84.5 fL (ref 80.0–100.0)
Monocytes Absolute: 1.6 10*3/uL — ABNORMAL HIGH (ref 0.1–1.0)
Monocytes Relative: 13 %
Neutro Abs: 8.6 10*3/uL — ABNORMAL HIGH (ref 1.7–7.7)
Neutrophils Relative %: 71 %
Platelets: 240 10*3/uL (ref 150–400)
RBC: 5.76 MIL/uL (ref 4.22–5.81)
RDW: 12.7 % (ref 11.5–15.5)
WBC: 12.2 10*3/uL — ABNORMAL HIGH (ref 4.0–10.5)
nRBC: 0 % (ref 0.0–0.2)

## 2019-11-25 LAB — COMPREHENSIVE METABOLIC PANEL
ALT: 61 U/L — ABNORMAL HIGH (ref 0–44)
AST: 52 U/L — ABNORMAL HIGH (ref 15–41)
Albumin: 3.6 g/dL (ref 3.5–5.0)
Alkaline Phosphatase: 89 U/L (ref 38–126)
Anion gap: 14 (ref 5–15)
BUN: 37 mg/dL — ABNORMAL HIGH (ref 8–23)
CO2: 27 mmol/L (ref 22–32)
Calcium: 8.8 mg/dL — ABNORMAL LOW (ref 8.9–10.3)
Chloride: 95 mmol/L — ABNORMAL LOW (ref 98–111)
Creatinine, Ser: 1.13 mg/dL (ref 0.61–1.24)
GFR calc Af Amer: >60 mL/min (ref >60)
GFR calc non Af Amer: >60 mL/min (ref >60)
Glucose, Bld: 96 mg/dL (ref 70–99)
Potassium: 3.1 mmol/L — ABNORMAL LOW (ref 3.5–5.1)
Sodium: 136 mmol/L (ref 135–145)
Total Bilirubin: 1.3 mg/dL — ABNORMAL HIGH (ref 0.3–1.2)
Total Protein: 7.5 g/dL (ref 6.5–8.1)

## 2019-11-25 LAB — D-DIMER, QUANTITATIVE: D-Dimer, Quant: 0.67 ug/mL-FEU — ABNORMAL HIGH (ref 0.00–0.50)

## 2019-11-25 LAB — GLUCOSE, CAPILLARY
Glucose-Capillary: 165 mg/dL — ABNORMAL HIGH (ref 70–99)
Glucose-Capillary: 284 mg/dL — ABNORMAL HIGH (ref 70–99)
Glucose-Capillary: 322 mg/dL — ABNORMAL HIGH (ref 70–99)
Glucose-Capillary: 320 mg/dL — ABNORMAL HIGH (ref 70–99)

## 2019-11-25 LAB — C-REACTIVE PROTEIN: CRP: 0.7 mg/dL (ref <1.0)

## 2019-11-25 LAB — FERRITIN: Ferritin: 1965 ng/mL — ABNORMAL HIGH (ref 24–336)

## 2019-11-25 MED ORDER — POTASSIUM CHLORIDE CRYS ER 20 MEQ PO TBCR
40.0000 meq | EXTENDED_RELEASE_TABLET | ORAL | Status: AC
  Administered 2019-11-25 (×2): 40 meq via ORAL
  Filled 2019-11-25 (×3): qty 2

## 2019-11-25 MED ORDER — ALPRAZOLAM 0.25 MG PO TABS
0.2500 mg | ORAL_TABLET | Freq: Three times a day (TID) | ORAL | Status: DC | PRN
  Administered 2019-11-25 – 2019-11-26 (×2): 0.25 mg via ORAL
  Filled 2019-11-25 (×2): qty 1

## 2019-11-25 MED ORDER — ZOLPIDEM TARTRATE 5 MG PO TABS
5.0000 mg | ORAL_TABLET | Freq: Every evening | ORAL | Status: DC | PRN
  Administered 2019-11-25: 5 mg via ORAL
  Filled 2019-11-25: qty 1

## 2019-11-25 NOTE — Progress Notes (Signed)
PROGRESS NOTE                                                                                                                                                                                                             Patient Demographics:    [name], is a 63 y.o. male, DOB - Dec 25, 1955, MLY:[id number]  Outpatient Primary MD for the patient is [name], MD   Admit date - 11/20/2019   LOS - 5  Chief Complaint  Patient presents with  . COVID  . Shortness of Breath  . Fever       Brief Narrative: Patient is a 63 y.o. male with PMHx of DM-2, HTN, obesity-who presented with at least 1 week history of weakness and shortness of breath-he was found to have acute hypoxic respiratory failure in setting of COVID-19 pneumonia.  Significant events: 12/6>> admit to [facility] 12/8>> transferred to [facility]   Subjective:    [name] feels better-he has been titrated down to 2.5 L of oxygen.   Assessment  & Plan :   Acute Hypoxic Resp Failure due to Covid 19 Viral pneumonia: Improved with steroids and remdesivir, down to just 2.5 l of oxygen.  Continue at attempts to titrate down FiO2 further.  Fever: afebrile  O2 requirements:  SpO2: 94 % O2 Flow Rate (L/min): 2.5 L/min   COVID-19 Labs: Recent Labs    11/23/19 0100 11/24/19 0100 11/24/19 0115 11/25/19 0210  DDIMER 1.37*  --  0.71* 0.67*  FERRITIN 3,186* 2,032*  --  1,965*  CRP 1.8* 0.9  --  0.7       Component Value Date/Time   BNP 22.5 11/21/2019 0500    Recent Labs  Lab 11/20/19 1626  PROCALCITON 0.34    No results found for: SARSCOV2NAA   COVID-19 Medications: Steroids: 12/6>> Remdesivir: 12/6>> 12/10  Other medications: Diuretics:Euvolemic-continue intravenous Lasix to maintain negative balance.  Follow weights/electrolytes/volume status. Antibiotics:Not needed as no evidence of bacterial  infection  Prone/Incentive Spirometry: encouraged patient to lie prone for 3-4 hours at a time for a total of 16 hours a day, and to encourage incentive spirometry use 3-4/hour.  DVT Prophylaxis  :  Lovenox  AKI: Hemodynamically mediated-follow.  Hypokalemia: Replete and recheck  Transaminitis: Likely secondary to COVID-19-follow for now-slowly downtrending.  Thrombocytopenia: Secondary to COVID-19-resolved  DM-2 (A1c 8.1 on 12/7): CBGs relatively stable-continue Lantus 32 units daily, 14 units of premeal NovoLog and SSI.  Follow and adjust.  CBG (last 3)  Recent Labs    11/24/19 1700 11/24/19 2042 11/25/19 0734  GLUCAP 320* 259* 165*   Dyslipidemia: Continue statin  HTN: Controlled- continue atenolol  Obesity: Estimated body mass index is 37.23 kg/m as calculated from the following:   Height as of this encounter: 5\' 9"  (1.753 m).   Weight as of this encounter: 114.4 kg.    Consults  :  None  Procedures  :  None  ABG:    Component Value Date/Time   PHART 7.390 01/03/2011 1232   PCO2ART 36.3 01/03/2011 1232   PO2ART 70.7 (L) 01/03/2011 1232   HCO3 21.5 01/03/2011 1232   TCO2 26 03/13/2013 0754   ACIDBASEDEF 2.4 (H) 01/03/2011 1232   O2SAT 94.0 01/03/2011 1232    Vent Settings: N/A  Condition - Extremely Guarded  Family Communication  :  Spouse updated over the phone on 12/11  Code Status :  Full Code  Diet :  Diet Order            Diet Carb Modified Fluid consistency: Thin; Room service appropriate? Yes  Diet effective now               Disposition Plan  :  Remain hospitalized  Barriers to discharge: Hypoxia requiring O2 supplementation/complete 5 days of IV Remdesivir  Antimicorbials  :    Anti-infectives (From admission, onward)   Start     Dose/Rate Route Frequency Ordered Stop   11/21/19 1000  remdesivir 100 mg in sodium chloride 0.9 % 100 mL IVPB     100 mg 200 mL/hr over 30 Minutes Intravenous Daily 11/20/19 1929 11/24/19 1017    11/20/19 2000  remdesivir 200 mg in sodium chloride 0.9% 250 mL IVPB     200 mg 580 mL/hr over 30 Minutes Intravenous Once 11/20/19 1929 11/20/19 2340      Inpatient Medications  Scheduled Meds: . allopurinol  300 mg Oral QAC breakfast  . aspirin EC  81 mg Oral Daily  . atenolol  50 mg Oral Daily  . cholecalciferol  1,000 Units Oral Daily  . dexamethasone (DECADRON) injection  6 mg Intravenous Q24H  . enoxaparin (LOVENOX) injection  55 mg Subcutaneous Q24H  . furosemide  40 mg Intravenous Daily  . insulin aspart  0-20 Units Subcutaneous TID WC  . insulin aspart  0-5 Units Subcutaneous QHS  . insulin aspart  14 Units Subcutaneous TID WC  . insulin glargine  32 Units Subcutaneous Daily  . mouth rinse  15 mL Mouth Rinse BID  . multivitamin with minerals  1 tablet Oral Daily  . pantoprazole  40 mg Oral Daily  . rosuvastatin  5 mg Oral Q72H  . vitamin C  500 mg Oral Daily  . zinc sulfate  220 mg Oral Daily   Continuous Infusions:  PRN Meds:.albuterol, diphenhydrAMINE, loratadine, pneumococcal 23 valent vaccine   Time Spent in minutes  25  See all Orders from today for further details   Oren Binet M.D on 11/25/2019 at 1:32 PM  To page go to [email or web address] - use universal password  [facility] -  Office  [phone]    Objective:   Vitals:   11/25/19 0003 11/25/19 0500 11/25/19 0801 11/25/19 1149  BP: (!) 142/82 131/85 (!) 147/89 122/86  Pulse: 73 75  73  Resp: (!) 24 (!) 23 20 20  Temp: 98.7 F (37.1 C) 98.2 F (36.8 C) 97.7 F (36.5 C) 97.8 F (36.6 C)  TempSrc: Oral Oral Oral Oral  SpO2: 94% 94% (!) 88% 94%  Weight:      Height:        Wt Readings from Last 3 Encounters:  11/21/19 114.4 kg  04/06/13 115.2 kg  03/28/13 115.2 kg     Intake/Output Summary (Last 24 hours) at 11/25/2019 1332 Last data filed at 11/25/2019 1200 Gross per 24 hour  Intake 360 ml  Output 1900 ml  Net -1540 ml     Physical Exam Gen Exam:Alert awake-not in any  distress HEENT:atraumatic, normocephalic Chest: B/L clear to auscultation anteriorly CVS:S1S2 regular Abdomen:soft non tender, non distended Extremities:no edema Neurology: Non focal Skin: no rash   Data Review:    CBC Recent Labs  Lab 11/21/19 0500 11/22/19 0500 11/23/19 0100 11/24/19 0115 11/25/19 0210  WBC 3.0* 6.0 9.0 9.5 12.2*  HGB 16.3 16.2 16.2 15.6 16.5  HCT 48.6 47.5 47.9 46.8 48.7  PLT 102* 148* 181 186 240  MCV 86.5 85.4 86.2 86.5 84.5  MCH 29.0 29.1 29.1 28.8 28.6  MCHC 33.5 34.1 33.8 33.3 33.9  RDW 13.0 12.7 13.0 12.9 12.7  LYMPHSABS 1.2 1.0 1.6 1.4 1.9  MONOABS 0.4 0.5 0.5 1.4* 1.6*  EOSABS 0.0 0.0 0.0 0.0 0.0  BASOSABS 0.0 0.0 0.0 0.1 0.1    Chemistries  Recent Labs  Lab 11/20/19 1626 11/21/19 0500 11/22/19 0500 11/23/19 0100 11/24/19 0115 11/25/19 0210  NA 131*  --  138 139 137 136  K 3.7  --  4.2 3.7 3.9 3.1*  CL 95*  --  98 100 97* 95*  CO2 23  --  GLUCOSE 294*  --  278* 147* 150* 96  BUN 28*  --  42* 45* 41* 37*  CREATININE 1.44*  --  1.48* 1.18 1.32* 1.13  CALCIUM 8.4*  --  8.6* 8.9 8.7* 8.8*  MG  --  2.0  --   --   --   --   AST 94*  --  76* 62* 69* 52*  ALT 48*  --  45* 49* 57* 61*  ALKPHOS 87  --  85 87 82 89  BILITOT 1.1  --  1.3* 1.2 1.2 1.3*   ------------------------------------------------------------------------------------------------------------------ No results for input(s): CHOL, HDL, LDLCALC, TRIG, CHOLHDL, LDLDIRECT in the last 72 hours.  Lab Results  Component Value Date   HGBA1C 8.1 (H) 11/21/2019   ------------------------------------------------------------------------------------------------------------------ No results for input(s): TSH, T4TOTAL, T3FREE, THYROIDAB in the last 72 hours.  Invalid input(s): FREET3 ------------------------------------------------------------------------------------------------------------------ Recent Labs    11/24/19 0100 11/25/19 0210  FERRITIN 2,032* 1,965*     Coagulation profile Recent Labs  Lab 11/21/19 0500  INR 1.0    Recent Labs    11/24/19 0115 11/25/19 0210  DDIMER 0.71* 0.67*    Cardiac Enzymes No results for input(s): CKMB, TROPONINI, MYOGLOBIN in the last 168 hours.  Invalid input(s): CK ------------------------------------------------------------------------------------------------------------------    Component Value Date/Time   BNP 22.5 11/21/2019 0500    Micro Results Recent Results (from the past 240 hour(s))  Culture, blood (Routine X 2) w Reflex to ID Panel     Status: None   Collection Time: 11/20/19 10:40 PM   Specimen: BLOOD  Result Value Ref Range Status   Specimen Description BLOOD LEFT HAND  Final   Special Requests   Final    BOTTLES DRAWN AEROBIC AND ANAEROBIC Blood Culture  results may not be optimal due to an inadequate volume of blood received in culture bottles   Culture   Final    NO GROWTH 5 DAYS Performed at [facility], [address] [place], [place] [address]    Report Status 11/25/2019 FINAL  Final  Culture, blood (Routine X 2) w Reflex to ID Panel     Status: None   Collection Time: 11/20/19 10:45 PM   Specimen: BLOOD  Result Value Ref Range Status   Specimen Description BLOOD RIGHT UPPER ARM  Final   Special Requests   Final    BOTTLES DRAWN AEROBIC AND ANAEROBIC Blood Culture results may not be optimal due to an inadequate volume of blood received in culture bottles   Culture   Final    NO GROWTH 5 DAYS Performed at [facility], [address] [place], [place] [address]    Report Status 11/25/2019 FINAL  Final    Radiology Reports DG Chest Portable 1 View  Result Date: 11/20/2019 CLINICAL DATA:  Shortness of breath, COVID positive EXAM: PORTABLE CHEST 1 VIEW COMPARISON:  None. FINDINGS: The heart size is mildly enlarged. The lung volumes are low. Patchy bilateral interstitial and airspace opacities are noted. There is no pleural effusion or pneumothorax. The  visualized skeletal structures are unremarkable. IMPRESSION: 1. Patchy bilateral interstitial and airspace opacities likely reflect COVID-19 pneumonia. Electronically Signed   By: Romona Curlsyler  Litton M.D.   On: 11/20/2019 17:22

## 2019-11-25 NOTE — Progress Notes (Signed)
Pt. Is complaining of having anxiety and he is tearful. MD contacted per RN, xanax prn order and given

## 2019-11-26 DIAGNOSIS — E119 Type 2 diabetes mellitus without complications: Secondary | ICD-10-CM

## 2019-11-26 LAB — GLUCOSE, CAPILLARY
Glucose-Capillary: 128 mg/dL — ABNORMAL HIGH (ref 70–99)
Glucose-Capillary: 229 mg/dL — ABNORMAL HIGH (ref 70–99)

## 2019-11-26 LAB — COMPREHENSIVE METABOLIC PANEL
ALT: 55 U/L — ABNORMAL HIGH (ref 0–44)
AST: 35 U/L (ref 15–41)
Albumin: 3.5 g/dL (ref 3.5–5.0)
Alkaline Phosphatase: 80 U/L (ref 38–126)
Anion gap: 12 (ref 5–15)
BUN: 35 mg/dL — ABNORMAL HIGH (ref 8–23)
CO2: 26 mmol/L (ref 22–32)
Calcium: 9.1 mg/dL (ref 8.9–10.3)
Chloride: 99 mmol/L (ref 98–111)
Creatinine, Ser: 1.09 mg/dL (ref 0.61–1.24)
GFR calc Af Amer: >60 mL/min (ref >60)
GFR calc non Af Amer: >60 mL/min (ref >60)
Glucose, Bld: 111 mg/dL — ABNORMAL HIGH (ref 70–99)
Potassium: 3.8 mmol/L (ref 3.5–5.1)
Sodium: 137 mmol/L (ref 135–145)
Total Bilirubin: 1.3 mg/dL — ABNORMAL HIGH (ref 0.3–1.2)
Total Protein: 7.3 g/dL (ref 6.5–8.1)

## 2019-11-26 LAB — MAGNESIUM: Magnesium: 2.3 mg/dL (ref 1.7–2.4)

## 2019-11-26 LAB — FERRITIN: Ferritin: 1667 ng/mL — ABNORMAL HIGH (ref 24–336)

## 2019-11-26 LAB — D-DIMER, QUANTITATIVE: D-Dimer, Quant: 0.52 ug/mL-FEU — ABNORMAL HIGH (ref 0.00–0.50)

## 2019-11-26 LAB — C-REACTIVE PROTEIN: CRP: 1.2 mg/dL — ABNORMAL HIGH (ref <1.0)

## 2019-11-26 MED ORDER — DEXAMETHASONE 6 MG PO TABS: 6.0000 mg | ORAL_TABLET | Freq: Every day | ORAL | 0 refills | Status: AC

## 2019-11-26 MED ORDER — ATENOLOL-CHLORTHALIDONE 100-25 MG PO TABS: 0.5000 | ORAL_TABLET | Freq: Every day | ORAL | Status: AC

## 2019-11-26 NOTE — Progress Notes (Signed)
Pt discharged home stable with wife. No further concerns

## 2019-11-26 NOTE — Discharge Instructions (Signed)
COVID-19 COVID-19 is a respiratory infection that is caused by a virus called severe acute respiratory syndrome coronavirus 2 (SARS-CoV-2). The disease is also known as coronavirus disease or novel coronavirus. In some people, the virus may not cause any symptoms. In others, it may cause a serious infection. The infection can get worse quickly and can lead to complications, such as:  Pneumonia, or infection of the lungs.  Acute respiratory distress syndrome or ARDS. This is fluid build-up in the lungs.  Acute respiratory failure. This is a condition in which there is not enough oxygen passing from the lungs to the body.  Sepsis or septic shock. This is a serious bodily reaction to an infection.  Blood clotting problems.  Secondary infections due to bacteria or fungus. The virus that causes COVID-19 is contagious. This means that it can spread from person to person through droplets from coughs and sneezes (respiratory secretions). What are the causes? This illness is caused by a virus. You may catch the virus by:  Breathing in droplets from an infected person's cough or sneeze.  Touching something, like a table or a doorknob, that was exposed to the virus (contaminated) and then touching your mouth, nose, or eyes. What increases the risk? Risk for infection You are more likely to be infected with this virus if you:  Live in or travel to an area with a COVID-19 outbreak.  Come in contact with a sick person who recently traveled to an area with a COVID-19 outbreak.  Provide care for or live with a person who is infected with COVID-19. Risk for serious illness You are more likely to become seriously ill from the virus if you:  Are 65 years of age or older.  Have a long-term disease that lowers your body's ability to fight infection (immunocompromised).  Live in a nursing home or long-term care facility.  Have a long-term (chronic) disease such as: ? Chronic lung disease, including  chronic obstructive pulmonary disease or asthma ? Heart disease. ? Diabetes. ? Chronic kidney disease. ? Liver disease.  Are obese. What are the signs or symptoms? Symptoms of this condition can range from mild to severe. Symptoms may appear any time from 2 to 14 days after being exposed to the virus. They include:  A fever.  A cough.  Difficulty breathing.  Chills.  Muscle pains.  A sore throat.  Loss of taste or smell. Some people may also have stomach problems, such as nausea, vomiting, or diarrhea. Other people may not have any symptoms of COVID-19. How is this diagnosed? This condition may be diagnosed based on:  Your signs and symptoms, especially if: ? You live in an area with a COVID-19 outbreak. ? You recently traveled to or from an area where the virus is common. ? You provide care for or live with a person who was diagnosed with COVID-19.  A physical exam.  Lab tests, which may include: ? A nasal swab to take a sample of fluid from your nose. ? A throat swab to take a sample of fluid from your throat. ? A sample of mucus from your lungs (sputum). ? Blood tests.  Imaging tests, which may include, X-rays, CT scan, or ultrasound. How is this treated? At present, there is no medicine to treat COVID-19. Medicines that treat other diseases are being used on a trial basis to see if they are effective against COVID-19. Your health care provider will talk with you about ways to treat your symptoms. For most   people, the infection is mild and can be managed at home with rest, fluids, and over-the-counter medicines. Treatment for a serious infection usually takes places in a hospital intensive care unit (ICU). It may include one or more of the following treatments. These treatments are given until your symptoms improve.  Receiving fluids and medicines through an IV.  Supplemental oxygen. Extra oxygen is given through a tube in the nose, a face mask, or a  hood.  Positioning you to lie on your stomach (prone position). This makes it easier for oxygen to get into the lungs.  Continuous positive airway pressure (CPAP) or bi-level positive airway pressure (BPAP) machine. This treatment uses mild air pressure to keep the airways open. A tube that is connected to a motor delivers oxygen to the body.  Ventilator. This treatment moves air into and out of the lungs by using a tube that is placed in your windpipe.  Tracheostomy. This is a procedure to create a hole in the neck so that a breathing tube can be inserted.  Extracorporeal membrane oxygenation (ECMO). This procedure gives the lungs a chance to recover by taking over the functions of the heart and lungs. It supplies oxygen to the body and removes carbon dioxide. Follow these instructions at home: Lifestyle  If you are sick, stay home except to get medical care. Your health care provider will tell you how long to stay home. Call your health care provider before you go for medical care.  Rest at home as told by your health care provider.  Do not use any products that contain nicotine or tobacco, such as cigarettes, e-cigarettes, and chewing tobacco. If you need help quitting, ask your health care provider.  Return to your normal activities as told by your health care provider. Ask your health care provider what activities are safe for you. General instructions  Take over-the-counter and prescription medicines only as told by your health care provider.  Drink enough fluid to keep your urine pale yellow.  Keep all follow-up visits as told by your health care provider. This is important. How is this prevented?  There is no vaccine to help prevent COVID-19 infection. However, there are steps you can take to protect yourself and others from this virus. To protect yourself:   Do not travel to areas where COVID-19 is a risk. The areas where COVID-19 is reported change often. To identify  high-risk areas and travel restrictions, check the CDC travel website: wwwnc.cdc.gov/travel/notices  If you live in, or must travel to, an area where COVID-19 is a risk, take precautions to avoid infection. ? Stay away from people who are sick. ? Wash your hands often with soap and water for 20 seconds. If soap and water are not available, use an alcohol-based hand sanitizer. ? Avoid touching your mouth, face, eyes, or nose. ? Avoid going out in public, follow guidance from your state and local health authorities. ? If you must go out in public, wear a cloth face covering or face mask. ? Disinfect objects and surfaces that are frequently touched every day. This may include:  Counters and tables.  Doorknobs and light switches.  Sinks and faucets.  Electronics, such as phones, remote controls, keyboards, computers, and tablets. To protect others: If you have symptoms of COVID-19, take steps to prevent the virus from spreading to others.  If you think you have a COVID-19 infection, contact your health care provider right away. Tell your health care team that you think you may   have a COVID-19 infection.  Stay home. Leave your house only to seek medical care. Do not use public transport.  Do not travel while you are sick.  Wash your hands often with soap and water for 20 seconds. If soap and water are not available, use alcohol-based hand sanitizer.  Stay away from other members of your household. Let healthy household members care for children and pets, if possible. If you have to care for children or pets, wash your hands often and wear a mask. If possible, stay in your own room, separate from others. Use a different bathroom.  Make sure that all people in your household wash their hands well and often.  Cough or sneeze into a tissue or your sleeve or elbow. Do not cough or sneeze into your hand or into the air.  Wear a cloth face covering or face mask. Where to find more  information  Centers for Disease Control and Prevention: StickerEmporium.tn  World Health Organization: https://thompson-craig.com/ Contact a health care provider if:  You live in or have traveled to an area where COVID-19 is a risk and you have symptoms of the infection.  You have had contact with someone who has COVID-19 and you have symptoms of the infection. Get help right away if:  You have trouble breathing.  You have pain or pressure in your chest.  You have confusion.  You have bluish lips and fingernails.  You have difficulty waking from sleep.  You have symptoms that get worse. These symptoms may represent a serious problem that is an emergency. Do not wait to see if the symptoms will go away. Get medical help right away. Call your local emergency services (911 in the [place] Do not drive yourself to the hospital. Let the emergency medical personnel know if you think you have COVID-19. Summary  COVID-19 is a respiratory infection that is caused by a virus. It is also known as coronavirus disease or novel coronavirus. It can cause serious infections, such as pneumonia, acute respiratory distress syndrome, acute respiratory failure, or sepsis.  The virus that causes COVID-19 is contagious. This means that it can spread from person to person through droplets from coughs and sneezes.  You are more likely to develop a serious illness if you are 31 years of age or older, have a weak immunity, live in a nursing home, or have chronic disease.  There is no medicine to treat COVID-19. Your health care provider will talk with you about ways to treat your symptoms.  Take steps to protect yourself and others from infection. Wash your hands often and disinfect objects and surfaces that are frequently touched every day. Stay away from people who are sick and wear a mask if you are sick. This information is not intended to replace advice given to you by  your health care provider. Make sure you discuss any questions you have with your health care provider. Document Released: 01/06/2019 Document Revised: 04/28/2019 Document Reviewed: 01/06/2019 Elsevier Patient Education  2020 Elsevier Inc.    Person Under Monitoring Name: [name]  Location: [address] [place] [address]   Infection Prevention Recommendations for Individuals Confirmed to have, or Being Evaluated for, 2019 Novel Coronavirus (COVID-19) Infection Who Receive Care at Home  Individuals who are confirmed to have, or are being evaluated for, COVID-19 should follow the prevention steps below until a healthcare provider or local or state health department says they can return to normal activities.  Stay home  except to get medical care You should restrict activities outside your home, except for getting medical care. Do not go to work, school, or public areas, and do not use public transportation or taxis.  Call ahead before visiting your doctor Before your medical appointment, call the healthcare provider and tell them that you have, or are being evaluated for, COVID-19 infection. This will help the healthcare provider's office take steps to keep other people from getting infected. Ask your healthcare provider to call the local or state health department.  Monitor your symptoms Seek prompt medical attention if your illness is worsening (e.g., difficulty breathing). Before going to your medical appointment, call the healthcare provider and tell them that you have, or are being evaluated for, COVID-19 infection. Ask your healthcare provider to call the local or state health department.  Wear a facemask You should wear a facemask that covers your nose and mouth when you are in the same room with other people and when you visit a healthcare provider. People who live with or visit you should also wear a facemask while they are in the same room with  you.  Separate yourself from other people in your home As much as possible, you should stay in a different room from other people in your home. Also, you should use a separate bathroom, if available.  Avoid sharing household items You should not share dishes, drinking glasses, cups, eating utensils, towels, bedding, or other items with other people in your home. After using these items, you should wash them thoroughly with soap and water.  Cover your coughs and sneezes Cover your mouth and nose with a tissue when you cough or sneeze, or you can cough or sneeze into your sleeve. Throw used tissues in a lined trash can, and immediately wash your hands with soap and water for at least 20 seconds or use an alcohol-based hand rub.  Wash your [employer] Corporationhands Wash your hands often and thoroughly with soap and water for at least 20 seconds. You can use an alcohol-based hand sanitizer if soap and water are not available and if your hands are not visibly dirty. Avoid touching your eyes, nose, and mouth with unwashed hands.   Prevention Steps for Caregivers and Household Members of Individuals Confirmed to have, or Being Evaluated for, COVID-19 Infection Being Cared for in the Home  If you live with, or provide care at home for, a person confirmed to have, or being evaluated for, COVID-19 infection please follow these guidelines to prevent infection:  Follow healthcare provider's instructions Make sure that you understand and can help the patient follow any healthcare provider instructions for all care.  Provide for the patient's basic needs You should help the patient with basic needs in the home and provide support for getting groceries, prescriptions, and other personal needs.  Monitor the patient's symptoms If they are getting sicker, call his or her medical provider and tell them that the patient has, or is being evaluated for, COVID-19 infection. This will help the healthcare provider's office  take steps to keep other people from getting infected. Ask the healthcare provider to call the local or state health department.  Limit the number of people who have contact with the patient  If possible, have only one caregiver for the patient.  Other household members should stay in another home or place of residence. If this is not possible, they should stay  in another room, or be separated from the patient as much as possible. Use  a separate bathroom, if available.  Restrict visitors who do not have an essential need to be in the home.  Keep older adults, very young children, and other sick people away from the patient Keep older adults, very young children, and those who have compromised immune systems or chronic health conditions away from the patient. This includes people with chronic heart, lung, or kidney conditions, diabetes, and cancer.  Ensure good ventilation Make sure that shared spaces in the home have good air flow, such as from an air conditioner or an opened window, weather permitting.  Wash your hands often  Wash your hands often and thoroughly with soap and water for at least 20 seconds. You can use an alcohol based hand sanitizer if soap and water are not available and if your hands are not visibly dirty.  Avoid touching your eyes, nose, and mouth with unwashed hands.  Use disposable paper towels to dry your hands. If not available, use dedicated cloth towels and replace them when they become wet.  Wear a facemask and gloves  Wear a disposable facemask at all times in the room and gloves when you touch or have contact with the patient's blood, body fluids, and/or secretions or excretions, such as sweat, saliva, sputum, nasal mucus, vomit, urine, or feces.  Ensure the mask fits over your nose and mouth tightly, and do not touch it during use.  Throw out disposable facemasks and gloves after using them. Do not reuse.  Wash your hands immediately after removing  your facemask and gloves.  If your personal clothing becomes contaminated, carefully remove clothing and launder. Wash your hands after handling contaminated clothing.  Place all used disposable facemasks, gloves, and other waste in a lined container before disposing them with other household waste.  Remove gloves and wash your hands immediately after handling these items.  Do not share dishes, glasses, or other household items with the patient  Avoid sharing household items. You should not share dishes, drinking glasses, cups, eating utensils, towels, bedding, or other items with a patient who is confirmed to have, or being evaluated for, COVID-19 infection.  After the person uses these items, you should wash them thoroughly with soap and water.  Wash laundry thoroughly  Immediately remove and wash clothes or bedding that have blood, body fluids, and/or secretions or excretions, such as sweat, saliva, sputum, nasal mucus, vomit, urine, or feces, on them.  Wear gloves when handling laundry from the patient.  Read and follow directions on labels of laundry or clothing items and detergent. In general, wash and dry with the warmest temperatures recommended on the label.  Clean all areas the individual has used often  Clean all touchable surfaces, such as counters, tabletops, doorknobs, bathroom fixtures, toilets, phones, keyboards, tablets, and bedside tables, every day. Also, clean any surfaces that may have blood, body fluids, and/or secretions or excretions on them.  Wear gloves when cleaning surfaces the patient has come in contact with.  Use a diluted bleach solution (e.g., dilute bleach with 1 part bleach and 10 parts water) or a household disinfectant with a label that says EPA-registered for coronaviruses. To make a bleach solution at home, add 1 tablespoon of bleach to 1 quart (4 cups) of water. For a larger supply, add  cup of bleach to 1 gallon (16 cups) of water.  Read labels  of cleaning products and follow recommendations provided on product labels. Labels contain instructions for safe and effective use of the cleaning product including precautions  you should take when applying the product, such as wearing gloves or eye protection and making sure you have good ventilation during use of the product.  Remove gloves and wash hands immediately after cleaning.  Monitor yourself for signs and symptoms of illness Caregivers and household members are considered close contacts, should monitor their health, and will be asked to limit movement outside of the home to the extent possible. Follow the monitoring steps for close contacts listed on the symptom monitoring form.   ? If you have additional questions, contact your local health department or call the epidemiologist on call at [phone] (available 24/7). ? This guidance is subject to change. For the most up-to-date guidance from [facility], please refer to their website: [email or web address].hu

## 2019-11-26 NOTE — Progress Notes (Signed)
SATURATION QUALIFICATIONS: (This note is used to comply with regulatory documentation for home oxygen)  Patient Saturations on Room Air at Rest = 87%  Patient Saturations on Room Air while Ambulating = 82%  Patient Saturations on 3 Liters of oxygen while Ambulating = 88%  Please briefly explain why patient needs home oxygen:pt. Was sob with ambulating

## 2019-11-26 NOTE — TOC Transition Note (Signed)
Transition of Care [facility]) - CM/SW Discharge Note   Patient Details  Name: [name] MRN: [id number] Date of Birth: Oct 17, 1956  [facility] CM/SW Contact:  Ninfa Meeker, RN Phone Number:  [phone] (working remotely) 11/26/2019, 11:46 AM   Clinical Narrative:   Case manager spoke with patient via telephone concerning discharge needs. Discussed options for DME agency. Referral was called to Learta Codding with [employer]. Patient confirmed his home address and contact number for his wife. Case manager requested that Brianne, [place] [facility] take a portable tank to patient's room for discharge. Patient will discharge after confirmation oxygen has been delivered to his home.    Final next level of care: Home/Self Care Barriers to Discharge: No Barriers Identified   Patient Goals and CMS Choice        Discharge Placement                       Discharge Plan and Services   Discharge Planning Services: CM Consult            DME Arranged: Oxygen   Date DME Agency Contacted: 11/26/19 Time DME Agency Contacted: [phone] Representative spoke with at [place]: [place]: NA        Social Determinants of Health ([place]) Interventions     Readmission Risk Interventions No flowsheet data found.

## 2019-11-26 NOTE — Discharge Summary (Signed)
PATIENT DETAILS Name: [name] Age: 63 y.o. Sex: male Date of Birth: May 04, 1956 MRN: [id number]. Admitting Physician: Sueanne Margarita, DO VOJ:[id number], Ivin Booty, MD  Admit Date: 11/20/2019 Discharge date: 11/26/2019  Recommendations for Outpatient Follow-up:  1. Follow up with PCP in 1-2 weeks 2. Please obtain CMP/CBC in one week 3. Repeat Chest Xray in 4-6 week 4. Please assess if home oxygen is still required at next visit with PCP.  Admitted From:  Home  Disposition: [place]: No  Equipment/Devices: Oxygen 2-3L  Discharge Condition: Stable/  CODE STATUS: FULL CODE  Diet recommendation:  Diet Order            Diet - low sodium heart healthy        Diet Carb Modified        Diet Carb Modified Fluid consistency: Thin; Room service appropriate? Yes  Diet effective now               Brief Summary: Brief Narrative: Patient is a 63 y.o. male with PMHx of DM-2, HTN, obesity-who presented with at least 1 week history of weakness and shortness of breath-he was found to have acute hypoxic respiratory failure in setting of COVID-19 pneumonia.  Significant events: 12/6>> admit to [facility] 12/8>> transferred to [facility] Course: Acute Hypoxic Resp Failure due to Covid 19 Viral pneumonia:  Significantly improved-at one point was on 8 L of high flow oxygen.  Treated with steroids and remdesivir.  For the past 2 days has been stable on 2-3 L of oxygen.  He desperately wants to go home today.  He has finished a course of remdesivir on 12/10.  Since he is stable on just 2-3 L of oxygen-I suspect he can be discharged with close monitoring in the outpatient setting.  Have spoken with his spouse-she is agreeable with this plan as well.  We will continue tapering steroids on discharge.  Have instructed patient to follow with PCP in the next 1-2 weeks to see if he still requires home O2.   COVID-19 Labs:  Recent Labs   11/24/19 0100 11/24/19 0115 11/25/19 0210 11/26/19 0023  DDIMER  --  0.71* 0.67* 0.52*  FERRITIN 2,032*  --  1,965* 1,667*  CRP 0.9  --  0.7 1.2*    No results found for: SARSCOV2NAA   COVID-19 Medications: Steroids: 12/6>> Remdesivir: 12/6>> 12/10  AKI: Hemodynamically mediated-resolved  Hypokalemia: Repleted  Transaminitis: Likely secondary to COVID-19-follow for now-slowly downtrending.  Repeat at PCPs office.  Thrombocytopenia: Secondary to COVID-19-resolved  DM-2 (A1c 8.1 on 12/7): CBGs relatively stable-doing usual home regimen of insulin.  Follow with PCP.  CBG (last 3)  Recent Labs    11/25/19 2021 11/26/19 0739 11/26/19 1144  GLUCAP 320* 128* 229*    Dyslipidemia: Continue statin  HTN: Controlled- continue atenolol CTZ  Obesity: Estimated body mass index is 37.23 kg/m as calculated from the following:   Height as of this encounter: 5\' 9"  (1.753 m).   Weight as of this encounter: 114.4 kg.      Procedures/Studies: None  Discharge Diagnoses:  Active Problems:   COVID-19   Respiratory failure (HCC)   Lactic acidosis   Thrombocytopenia (HCC)   Hyponatremia   Discharge Instructions:    Person Under Monitoring Name: [name]  Location: [address] [place] [address]   Infection Prevention Recommendations for Individuals Confirmed to have, or Being Evaluated for, 2019 Novel Coronavirus (COVID-19) Infection  Who Receive Care at Home  Individuals who are confirmed to have, or are being evaluated for, COVID-19 should follow the prevention steps below until a healthcare provider or local or state health department says they can return to normal activities.  Stay home except to get medical care You should restrict activities outside your home, except for getting medical care. Do not go to work, school, or public areas, and do not use public transportation or taxis.  Call ahead before visiting your doctor Before  your medical appointment, call the healthcare provider and tell them that you have, or are being evaluated for, COVID-19 infection. This will help the healthcare provider's office take steps to keep other people from getting infected. Ask your healthcare provider to call the local or state health department.  Monitor your symptoms Seek prompt medical attention if your illness is worsening (e.g., difficulty breathing). Before going to your medical appointment, call the healthcare provider and tell them that you have, or are being evaluated for, COVID-19 infection. Ask your healthcare provider to call the local or state health department.  Wear a facemask You should wear a facemask that covers your nose and mouth when you are in the same room with other people and when you visit a healthcare provider. People who live with or visit you should also wear a facemask while they are in the same room with you.  Separate yourself from other people in your home As much as possible, you should stay in a different room from other people in your home. Also, you should use a separate bathroom, if available.  Avoid sharing household items You should not share dishes, drinking glasses, cups, eating utensils, towels, bedding, or other items with other people in your home. After using these items, you should wash them thoroughly with soap and water.  Cover your coughs and sneezes Cover your mouth and nose with a tissue when you cough or sneeze, or you can cough or sneeze into your sleeve. Throw used tissues in a lined trash can, and immediately wash your hands with soap and water for at least 20 seconds or use an alcohol-based hand rub.  Wash your [employer] your hands often and thoroughly with soap and water for at least 20 seconds. You can use an alcohol-based hand sanitizer if soap and water are not available and if your hands are not visibly dirty. Avoid touching your eyes, nose, and mouth with  unwashed hands.   Prevention Steps for Caregivers and Household Members of Individuals Confirmed to have, or Being Evaluated for, COVID-19 Infection Being Cared for in the Home  If you live with, or provide care at home for, a person confirmed to have, or being evaluated for, COVID-19 infection please follow these guidelines to prevent infection:  Follow healthcare provider's instructions Make sure that you understand and can help the patient follow any healthcare provider instructions for all care.  Provide for the patient's basic needs You should help the patient with basic needs in the home and provide support for getting groceries, prescriptions, and other personal needs.  Monitor the patient's symptoms If they are getting sicker, call his or her medical provider and tell them that the patient has, or is being evaluated for, COVID-19 infection. This will help the healthcare provider's office take steps to keep other people from getting infected. Ask the healthcare provider to call the local or state health department.  Limit the number of people who have contact with the patient  If  possible, have only one caregiver for the patient.  Other household members should stay in another home or place of residence. If this is not possible, they should stay  in another room, or be separated from the patient as much as possible. Use a separate bathroom, if available.  Restrict visitors who do not have an essential need to be in the home.  Keep older adults, very young children, and other sick people away from the patient Keep older adults, very young children, and those who have compromised immune systems or chronic health conditions away from the patient. This includes people with chronic heart, lung, or kidney conditions, diabetes, and cancer.  Ensure good ventilation Make sure that shared spaces in the home have good air flow, such as from an air conditioner or an opened  window, weather permitting.  Wash your hands often  Wash your hands often and thoroughly with soap and water for at least 20 seconds. You can use an alcohol based hand sanitizer if soap and water are not available and if your hands are not visibly dirty.  Avoid touching your eyes, nose, and mouth with unwashed hands.  Use disposable paper towels to dry your hands. If not available, use dedicated cloth towels and replace them when they become wet.  Wear a facemask and gloves  Wear a disposable facemask at all times in the room and gloves when you touch or have contact with the patient's blood, body fluids, and/or secretions or excretions, such as sweat, saliva, sputum, nasal mucus, vomit, urine, or feces.  Ensure the mask fits over your nose and mouth tightly, and do not touch it during use.  Throw out disposable facemasks and gloves after using them. Do not reuse.  Wash your hands immediately after removing your facemask and gloves.  If your personal clothing becomes contaminated, carefully remove clothing and launder. Wash your hands after handling contaminated clothing.  Place all used disposable facemasks, gloves, and other waste in a lined container before disposing them with other household waste.  Remove gloves and wash your hands immediately after handling these items.  Do not share dishes, glasses, or other household items with the patient  Avoid sharing household items. You should not share dishes, drinking glasses, cups, eating utensils, towels, bedding, or other items with a patient who is confirmed to have, or being evaluated for, COVID-19 infection.  After the person uses these items, you should wash them thoroughly with soap and water.  Wash laundry thoroughly  Immediately remove and wash clothes or bedding that have blood, body fluids, and/or secretions or excretions, such as sweat, saliva, sputum, nasal mucus, vomit, urine, or feces, on them.  Wear gloves when  handling laundry from the patient.  Read and follow directions on labels of laundry or clothing items and detergent. In general, wash and dry with the warmest temperatures recommended on the label.  Clean all areas the individual has used often  Clean all touchable surfaces, such as counters, tabletops, doorknobs, bathroom fixtures, toilets, phones, keyboards, tablets, and bedside tables, every day. Also, clean any surfaces that may have blood, body fluids, and/or secretions or excretions on them.  Wear gloves when cleaning surfaces the patient has come in contact with.  Use a diluted bleach solution (e.g., dilute bleach with 1 part bleach and 10 parts water) or a household disinfectant with a label that says EPA-registered for coronaviruses. To make a bleach solution at home, add 1 tablespoon of bleach to 1 quart (4 cups) of  water. For a larger supply, add  cup of bleach to 1 gallon (16 cups) of water.  Read labels of cleaning products and follow recommendations provided on product labels. Labels contain instructions for safe and effective use of the cleaning product including precautions you should take when applying the product, such as wearing gloves or eye protection and making sure you have good ventilation during use of the product.  Remove gloves and wash hands immediately after cleaning.  Monitor yourself for signs and symptoms of illness Caregivers and household members are considered close contacts, should monitor their health, and will be asked to limit movement outside of the home to the extent possible. Follow the monitoring steps for close contacts listed on the symptom monitoring form.   ? If you have additional questions, contact your local health department or call the epidemiologist on call at [phone] (available 24/7). ? This guidance is subject to change. For the most up-to-date guidance from CDC, please refer to their  website: TripMetro.huhttps://www.cdc.gov/coronavirus/2019-ncov/hcp/guidance-prevent-spread.html    Activity:  As tolerated    Discharge Instructions    Call MD for:  difficulty breathing, headache or visual disturbances   Complete by: As directed    Call MD for:  extreme fatigue   Complete by: As directed    Call MD for:  persistant dizziness or light-headedness   Complete by: As directed    Call MD for:  persistant nausea and vomiting   Complete by: As directed    Diet - low sodium heart healthy   Complete by: As directed    Diet Carb Modified   Complete by: As directed    Discharge instructions   Complete by: As directed    Follow with Primary MD  Mila PalmerWolters, Sharon, MD in 1-2 weeks  Please get a complete blood count and chemistry panel checked by your Primary MD at your next visit, and again as instructed by your Primary MD.  Get Medicines reviewed and adjusted: Please take all your medications with you for your next visit with your Primary MD  Laboratory/radiological data: Please request your Primary MD to go over all hospital tests and procedure/radiological results at the follow up, please ask your Primary MD to get all Hospital records sent to his/her office.  In some cases, they will be blood work, cultures and biopsy results pending at the time of your discharge. Please request that your primary care M.D. follows up on these results.  Also Note the following: If you experience worsening of your admission symptoms, develop shortness of breath, life threatening emergency, suicidal or homicidal thoughts you must seek medical attention immediately by calling 911 or calling your MD immediately  if symptoms less severe.  You must read complete instructions/literature along with all the possible adverse reactions/side effects for all the Medicines you take and that have been prescribed to you. Take any new Medicines after you have completely understood and accpet all the possible adverse  reactions/side effects.   Do not drive when taking Pain medications or sleeping medications (Benzodaizepines)  Do not take more than prescribed Pain, Sleep and Anxiety Medications. It is not advisable to combine anxiety,sleep and pain medications without talking with your primary care practitioner  Special Instructions: If you have smoked or chewed Tobacco  in the last 2 yrs please stop smoking, stop any regular Alcohol  and or any Recreational drug use.  Wear Seat belts while driving.  Please note: You were cared for by a hospitalist during your hospital stay. Once  you are discharged, your primary care physician will handle any further medical issues. Please note that NO REFILLS for any discharge medications will be authorized once you are discharged, as it is imperative that you return to your primary care physician (or establish a relationship with a primary care physician if you do not have one) for your post hospital discharge needs so that they can reassess your need for medications and monitor your lab values.   3 weeks of isolation from 11/20/2019   Increase activity slowly   Complete by: As directed      Allergies as of 11/26/2019      Reactions   Fenofibrate Other (See Comments)   Elevated LFTs   Lisinopril Swelling   Per-orbital swelling   Metformin Diarrhea   Other Swelling   Prevpak (prevacid, amoxicillin, clarithromycin) caused tongue swelling      Medication List    TAKE these medications   allopurinol 300 MG tablet Commonly known as: ZYLOPRIM Take 150-300 mg by mouth daily before breakfast.   atenolol-chlorthalidone 100-25 MG tablet Commonly known as: TENORETIC Take 0.5 tablets by mouth daily. What changed: how much to take   Basaglar KwikPen 100 UNIT/ML Sopn Inject 30 Units into the skin daily.   cetirizine 10 MG tablet Commonly known as: ZYRTEC Take 10 mg by mouth daily.   dexamethasone 6 MG tablet Commonly known as: DECADRON Take 1 tablet (6 mg  total) by mouth daily for 4 days.   Dexilant 60 MG capsule Generic drug: dexlansoprazole Take 60 mg by mouth daily.   multivitamin with minerals Tabs tablet Take 1 tablet by mouth daily.   NovoLOG FlexPen 100 UNIT/ML FlexPen Generic drug: insulin aspart Inject 2-10 Units into the skin 3 (three) times daily with meals.   rosuvastatin 5 MG tablet Commonly known as: CRESTOR Take 5 mg by mouth every 3 (three) days.            Durable Medical Equipment  (From admission, onward)         Start     Ordered   11/26/19 0849  For home use only DME oxygen  Once    Question Answer Comment  Length of Need 6 Months   Mode or (Route) Nasal cannula   Liters per Minute 2   Frequency Continuous (stationary and portable oxygen unit needed)   Oxygen conserving device Yes   Oxygen delivery system Gas      11/26/19 0848         Follow-up Information    [name], MD. Schedule an appointment as soon as possible for a visit in 1 week(s).   Specialty: Family Medicine Contact information: [address] [place] [address] [phone]          Allergies  Allergen Reactions  . Fenofibrate Other (See Comments)    Elevated LFTs   . Lisinopril Swelling    Per-orbital swelling  . Metformin Diarrhea  . Other Swelling    Prevpak (prevacid, amoxicillin, clarithromycin) caused tongue swelling    Consultations:   None   Other Procedures/Studies: DG Chest Portable 1 View  Result Date: 11/20/2019 CLINICAL DATA:  Shortness of breath, COVID positive EXAM: PORTABLE CHEST 1 VIEW COMPARISON:  None. FINDINGS: The heart size is mildly enlarged. The lung volumes are low. Patchy bilateral interstitial and airspace opacities are noted. There is no pleural effusion or pneumothorax. The visualized skeletal structures are unremarkable. IMPRESSION: 1. Patchy bilateral interstitial and airspace opacities likely reflect COVID-19 pneumonia. Electronically  Signed   By:  Romona Curls M.D.   On: 11/20/2019 17:22     TODAY-DAY OF DISCHARGE:  Subjective:   Simuel Stebner today has no headache,no chest abdominal pain,no new weakness tingling or numbness, feels much better wants to go home today.   Objective:   Blood pressure 107/75, pulse 73, temperature 98.8 F (37.1 C), temperature source Oral, resp. rate (!) 23, height 5\' 9"  (1.753 m), weight 114.4 kg, SpO2 93 %.  Intake/Output Summary (Last 24 hours) at 11/26/2019 1206 Last data filed at 11/26/2019 1012 Gross per 24 hour  Intake 1200 ml  Output 2500 ml  Net -1300 ml   Filed Weights   11/21/19 0000  Weight: 114.4 kg    Exam: Awake Alert, Oriented *3, No new F.N deficits, Normal affect [place].AT,PERRAL Supple Neck,No JVD, No cervical lymphadenopathy appriciated.  Symmetrical Chest wall movement, Good air movement bilaterally, CTAB RRR,No Gallops,Rubs or new Murmurs, No Parasternal Heave +ve B.Sounds, Abd Soft, Non tender, No organomegaly appriciated, No rebound -guarding or rigidity. No Cyanosis, Clubbing or edema, No new Rash or bruise   PERTINENT RADIOLOGIC STUDIES: DG Chest Portable 1 View  Result Date: 11/20/2019 CLINICAL DATA:  Shortness of breath, COVID positive EXAM: PORTABLE CHEST 1 VIEW COMPARISON:  None. FINDINGS: The heart size is mildly enlarged. The lung volumes are low. Patchy bilateral interstitial and airspace opacities are noted. There is no pleural effusion or pneumothorax. The visualized skeletal structures are unremarkable. IMPRESSION: 1. Patchy bilateral interstitial and airspace opacities likely reflect COVID-19 pneumonia. Electronically Signed   By: 14/05/2019 M.D.   On: 11/20/2019 17:22     PERTINENT LAB RESULTS: CBC: Recent Labs    11/24/19 0115 11/25/19 0210  WBC 9.5 12.2*  HGB 15.6 16.5  HCT 46.8 48.7  PLT 186 240   CMET CMP     Component Value Date/Time   NA 137 11/26/2019 0023   K 3.8 11/26/2019 0023   CL 99 11/26/2019 0023   CO2 26 11/26/2019  0023   GLUCOSE 111 (H) 11/26/2019 0023   BUN 35 (H) 11/26/2019 0023   CREATININE 1.09 11/26/2019 0023   CALCIUM 9.1 11/26/2019 0023   PROT 7.3 11/26/2019 0023   ALBUMIN 3.5 11/26/2019 0023   AST 35 11/26/2019 0023   ALT 55 (H) 11/26/2019 0023   ALKPHOS 80 11/26/2019 0023   BILITOT 1.3 (H) 11/26/2019 0023   GFRNONAA >60 11/26/2019 0023   GFRAA >60 11/26/2019 0023    GFR Estimated Creatinine Clearance: 88.8 mL/min (by C-G formula based on SCr of 1.09 mg/dL). No results for input(s): LIPASE, AMYLASE in the last 72 hours. No results for input(s): CKTOTAL, CKMB, CKMBINDEX, TROPONINI in the last 72 hours. Invalid input(s): POCBNP Recent Labs    11/25/19 0210 11/26/19 0023  DDIMER 0.67* 0.52*   No results for input(s): HGBA1C in the last 72 hours. No results for input(s): CHOL, HDL, LDLCALC, TRIG, CHOLHDL, LDLDIRECT in the last 72 hours. No results for input(s): TSH, T4TOTAL, T3FREE, THYROIDAB in the last 72 hours.  Invalid input(s): FREET3 Recent Labs    11/25/19 0210 11/26/19 0023  FERRITIN 1,965* 1,667*   Coags: No results for input(s): INR in the last 72 hours.  Invalid input(s): PT Microbiology: Recent Results (from the past 240 hour(s))  Culture, blood (Routine X 2) w Reflex to ID Panel     Status: None   Collection Time: 11/20/19 10:40 PM   Specimen: BLOOD  Result Value Ref Range Status   Specimen Description BLOOD  LEFT HAND  Final   Special Requests   Final    BOTTLES DRAWN AEROBIC AND ANAEROBIC Blood Culture results may not be optimal due to an inadequate volume of blood received in culture bottles   Culture   Final    NO GROWTH 5 DAYS Performed at [facility], [address], [place], [place] [address]    Report Status 11/25/2019 FINAL  Final  Culture, blood (Routine X 2) w Reflex to ID Panel     Status: None   Collection Time: 11/20/19 10:45 PM   Specimen: BLOOD  Result Value Ref Range Status   Specimen Description BLOOD RIGHT UPPER ARM  Final    Special Requests   Final    BOTTLES DRAWN AEROBIC AND ANAEROBIC Blood Culture results may not be optimal due to an inadequate volume of blood received in culture bottles   Culture   Final    NO GROWTH 5 DAYS Performed at [facility], [address] [place], [place] [address]    Report Status 11/25/2019 FINAL  Final    FURTHER DISCHARGE INSTRUCTIONS:  Get Medicines reviewed and adjusted: Please take all your medications with you for your next visit with your Primary MD  Laboratory/radiological data: Please request your Primary MD to go over all hospital tests and procedure/radiological results at the follow up, please ask your Primary MD to get all Hospital records sent to his/her office.  In some cases, they will be blood work, cultures and biopsy results pending at the time of your discharge. Please request that your primary care M.D. goes through all the records of your hospital data and follows up on these results.  Also Note the following: If you experience worsening of your admission symptoms, develop shortness of breath, life threatening emergency, suicidal or homicidal thoughts you must seek medical attention immediately by calling 911 or calling your MD immediately  if symptoms less severe.  You must read complete instructions/literature along with all the possible adverse reactions/side effects for all the Medicines you take and that have been prescribed to you. Take any new Medicines after you have completely understood and accpet all the possible adverse reactions/side effects.   Do not drive when taking Pain medications or sleeping medications (Benzodaizepines)  Do not take more than prescribed Pain, Sleep and Anxiety Medications. It is not advisable to combine anxiety,sleep and pain medications without talking with your primary care practitioner  Special Instructions: If you have smoked or chewed Tobacco  in the last 2 yrs please stop smoking, stop any regular Alcohol   and or any Recreational drug use.  Wear Seat belts while driving.  Please note: You were cared for by a hospitalist during your hospital stay. Once you are discharged, your primary care physician will handle any further medical issues. Please note that NO REFILLS for any discharge medications will be authorized once you are discharged, as it is imperative that you return to your primary care physician (or establish a relationship with a primary care physician if you do not have one) for your post hospital discharge needs so that they can reassess your need for medications and monitor your lab values.  Total Time spent coordinating discharge including counseling, education and face to face time equals 35 minutes.  SignedJeoffrey Massed 11/26/2019 12:06 PM

## 2019-12-19 ENCOUNTER — Other Ambulatory Visit: Payer: Self-pay

## 2019-12-27 ENCOUNTER — Ambulatory Visit
Admission: RE | Admit: 2019-12-27 | Discharge: 2019-12-27 | Disposition: A | Discharge status: Home / Self Care | Payer: BC Managed Care – PPO | Source: Ambulatory Visit | Attending: Family Medicine | Admitting: Family Medicine

## 2019-12-27 ENCOUNTER — Other Ambulatory Visit: Payer: Self-pay | Admitting: Family Medicine

## 2019-12-27 DIAGNOSIS — U071 COVID-19: Secondary | ICD-10-CM

## 2020-11-26 ENCOUNTER — Other Ambulatory Visit (HOSPITAL_BASED_OUTPATIENT_CLINIC_OR_DEPARTMENT_OTHER): Payer: Self-pay | Admitting: Physician Assistant

## 2020-11-26 ENCOUNTER — Emergency Department (HOSPITAL_BASED_OUTPATIENT_CLINIC_OR_DEPARTMENT_OTHER)
Admission: EM | Admit: 2020-11-26 | Discharge: 2020-11-26 | Disposition: A | Discharge status: Home / Self Care | Payer: BC Managed Care – PPO | Attending: Emergency Medicine | Admitting: Emergency Medicine

## 2020-11-26 ENCOUNTER — Encounter (HOSPITAL_BASED_OUTPATIENT_CLINIC_OR_DEPARTMENT_OTHER): Payer: Self-pay | Admitting: *Deleted

## 2020-11-26 ENCOUNTER — Other Ambulatory Visit: Payer: Self-pay

## 2020-11-26 ENCOUNTER — Emergency Department (HOSPITAL_BASED_OUTPATIENT_CLINIC_OR_DEPARTMENT_OTHER): Payer: BC Managed Care – PPO

## 2020-11-26 DIAGNOSIS — R1110 Vomiting, unspecified: Secondary | ICD-10-CM | POA: Diagnosis not present

## 2020-11-26 DIAGNOSIS — E119 Type 2 diabetes mellitus without complications: Secondary | ICD-10-CM | POA: Diagnosis not present

## 2020-11-26 DIAGNOSIS — Z794 Long term (current) use of insulin: Secondary | ICD-10-CM | POA: Diagnosis not present

## 2020-11-26 DIAGNOSIS — R140 Abdominal distension (gaseous): Secondary | ICD-10-CM | POA: Diagnosis not present

## 2020-11-26 DIAGNOSIS — Z79899 Other long term (current) drug therapy: Secondary | ICD-10-CM | POA: Diagnosis not present

## 2020-11-26 DIAGNOSIS — R1084 Generalized abdominal pain: Secondary | ICD-10-CM

## 2020-11-26 DIAGNOSIS — I10 Essential (primary) hypertension: Secondary | ICD-10-CM | POA: Insufficient documentation

## 2020-11-26 DIAGNOSIS — Z8616 Personal history of COVID-19: Secondary | ICD-10-CM | POA: Insufficient documentation

## 2020-11-26 LAB — CBC WITH DIFFERENTIAL/PLATELET
Abs Immature Granulocytes: 0.06 10*3/uL (ref 0.00–0.07)
Basophils Absolute: 0.1 10*3/uL (ref 0.0–0.1)
Basophils Relative: 1 %
Eosinophils Absolute: 0.1 10*3/uL (ref 0.0–0.5)
Eosinophils Relative: 1 %
HCT: 50.8 % (ref 39.0–52.0)
Hemoglobin: 17.6 g/dL — ABNORMAL HIGH (ref 13.0–17.0)
Immature Granulocytes: 1 %
Lymphocytes Relative: 23 %
Lymphs Abs: 2.6 10*3/uL (ref 0.7–4.0)
MCH: 29.4 pg (ref 26.0–34.0)
MCHC: 34.6 g/dL (ref 30.0–36.0)
MCV: 84.8 fL (ref 80.0–100.0)
Monocytes Absolute: 0.9 10*3/uL (ref 0.1–1.0)
Monocytes Relative: 8 %
Neutro Abs: 7.4 10*3/uL (ref 1.7–7.7)
Neutrophils Relative %: 66 %
Platelets: 284 10*3/uL (ref 150–400)
RBC: 5.99 MIL/uL — ABNORMAL HIGH (ref 4.22–5.81)
RDW: 12.4 % (ref 11.5–15.5)
WBC: 11.1 10*3/uL — ABNORMAL HIGH (ref 4.0–10.5)
nRBC: 0 % (ref 0.0–0.2)

## 2020-11-26 LAB — URINALYSIS, ROUTINE W REFLEX MICROSCOPIC
Bilirubin Urine: NEGATIVE
Glucose, UA: 250 mg/dL — AB
Hgb urine dipstick: NEGATIVE
Ketones, ur: NEGATIVE mg/dL
Leukocytes,Ua: NEGATIVE
Nitrite: NEGATIVE
Protein, ur: NEGATIVE mg/dL
Specific Gravity, Urine: 1.01 (ref 1.005–1.030)
pH: 6 (ref 5.0–8.0)

## 2020-11-26 LAB — COMPREHENSIVE METABOLIC PANEL
ALT: 39 U/L (ref 0–44)
AST: 24 U/L (ref 15–41)
Albumin: 4 g/dL (ref 3.5–5.0)
Alkaline Phosphatase: 133 U/L — ABNORMAL HIGH (ref 38–126)
Anion gap: 10 (ref 5–15)
BUN: 20 mg/dL (ref 8–23)
CO2: 27 mmol/L (ref 22–32)
Calcium: 9.8 mg/dL (ref 8.9–10.3)
Chloride: 95 mmol/L — ABNORMAL LOW (ref 98–111)
Creatinine, Ser: 1.15 mg/dL (ref 0.61–1.24)
GFR, Estimated: >60 mL/min (ref >60)
Glucose, Bld: 300 mg/dL — ABNORMAL HIGH (ref 70–99)
Potassium: 4.3 mmol/L (ref 3.5–5.1)
Sodium: 132 mmol/L — ABNORMAL LOW (ref 135–145)
Total Bilirubin: 0.8 mg/dL (ref 0.3–1.2)
Total Protein: 7.9 g/dL (ref 6.5–8.1)

## 2020-11-26 LAB — LIPASE, BLOOD: Lipase: 35 U/L (ref 11–51)

## 2020-11-26 MED ORDER — POLYETHYLENE GLYCOL 3350 17 G PO PACK: 17.0000 g | PACK | Freq: Every day | ORAL | 0 refills | Status: DC

## 2020-11-26 MED ORDER — ONDANSETRON HCL 4 MG/2ML IJ SOLN
4.0000 mg | Freq: Once | INTRAMUSCULAR | Status: AC
  Administered 2020-11-26: 13:00:00 4 mg via INTRAVENOUS
  Filled 2020-11-26: qty 2

## 2020-11-26 MED ORDER — IOHEXOL 300 MG/ML  SOLN
100.0000 mL | Freq: Once | INTRAMUSCULAR | Status: AC | PRN
  Administered 2020-11-26: 14:00:00 100 mL via INTRAVENOUS

## 2020-11-26 MED FILL — POLYETHYLENE GLYCOL 3350 PO: 17 | 14 days supply | Qty: 238 | Fill #0

## 2020-11-26 NOTE — ED Notes (Signed)
ED Provider at bedside. 

## 2020-11-26 NOTE — ED Triage Notes (Signed)
Abdominal pain and vomiting x 2 weeks. His MD this am thinks he has a bowel obstruction. Bloated. Abdomen is hard to touch.

## 2020-11-26 NOTE — Discharge Instructions (Signed)
Follow up with your Physician for recheck.  Return if any problems.  

## 2020-11-27 NOTE — ED Provider Notes (Signed)
[facility] EMERGENCY DEPARTMENT Provider Note   CSN: [id number] Arrival date & time: 11/26/20  1147     History Chief Complaint  Patient presents with  . Abdominal Pain  . Emesis    [name] is a 64 y.o. male.  The history is provided by the patient. No language interpreter was used.  Abdominal Pain Pain location:  Generalized Pain quality: aching   Pain radiates to:  Does not radiate Pain severity:  Severe Onset quality:  Gradual Duration:  3 weeks Timing:  Constant Progression:  Worsening Chronicity:  New Relieved by:  Nothing Worsened by:  Nothing Ineffective treatments:  None tried Associated symptoms: vomiting   Emesis Associated symptoms: abdominal pain    Pt reports his MD is concerned that he has a bowel blockage.  Pt reports he was diagnosed with pancreatitis but his MD told him this had resolved.      Past Medical History:  Diagnosis Date  . COVID-19   . Diabetes mellitus without complication (HCC)   . H/O seasonal allergies   . Hypertension   . Spinal stenosis of lumbar region     Patient Active Problem List   Diagnosis Date Noted  . COVID-19 11/20/2019  . Respiratory failure (HCC) 11/20/2019  . Lactic acidosis 11/20/2019  . Thrombocytopenia (HCC) 11/20/2019  . Hyponatremia 11/20/2019  . Hypertension   . Diabetes mellitus without complication (HCC)   . Hypoxia   . Acute renal failure (HCC)   . 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 COLONOSCOPY W/ POLYPECTOMY    . LUMBAR LAMINECTOMY/DECOMPRESSION MICRODISCECTOMY N/A 04/06/2013   Procedure: LUMBAR LAMINECTOMY/DECOMPRESSION MICRODISCECTOMY L4-L5;  Surgeon: Javier Docker, MD;  Location: [facility];  Service: Orthopedics;  Laterality: N/A;       No family history on file.  Social History   Tobacco Use  . Smoking status: Never Smoker  . Smokeless tobacco: Never Used  Substance Use  Topics  . Alcohol use: No  . Drug use: No    Home Medications Prior to Admission medications   Medication Sig Start Date End Date Taking? Authorizing Provider  allopurinol (ZYLOPRIM) 300 MG tablet Take 150-300 mg by mouth daily before breakfast.    Yes [provider]  atenolol-chlorthalidone (TENORETIC) 100-25 MG tablet Take 0.5 tablets by mouth daily. 11/26/19  Yes Ghimire, Werner Lean, MD  cetirizine (ZYRTEC) 10 MG tablet Take 10 mg by mouth daily.   Yes [provider]  dexlansoprazole (DEXILANT) 60 MG capsule Take 60 mg by mouth daily.   Yes [provider]  insulin aspart (NOVOLOG FLEXPEN) 100 UNIT/ML FlexPen Inject 2-10 Units into the skin 3 (three) times daily with meals.   Yes [provider]  Insulin Glargine (BASAGLAR KWIKPEN) 100 UNIT/ML SOPN Inject 30 Units into the skin daily.   Yes [provider]  lipase/protease/amylase (CREON) 12000-38000 units CPEP capsule Take by mouth.   Yes [provider]  Multiple Vitamin (MULTIVITAMIN WITH MINERALS) TABS tablet Take 1 tablet by mouth daily.   Yes [provider]  Omega-3 Fatty Acids (FISH OIL PO) Take by mouth.   Yes [provider]  rosuvastatin (CRESTOR) 5 MG tablet Take 5 mg by mouth every 3 (three) days. 10/13/19  Yes [provider]  polyethylene glycol (MIRALAX) 17 g packet Take 17 g by mouth daily. 11/26/20   Elson Areas, PA-C    Allergies    Fenofibrate,  Lisinopril, Metformin, and Other  Review of Systems   Review of Systems  Gastrointestinal: Positive for abdominal pain and vomiting.  All other systems reviewed and are negative.   Physical Exam Updated Vital Signs BP (!) 163/88   Pulse 65   Temp 98.3 F (36.8 C) (Oral)   Resp 18   Ht 5\' 9"  (1.753 m)   Wt 115 kg   SpO2 96%   BMI 37.44 kg/m   Physical Exam Vitals and nursing note reviewed.  Constitutional:      Appearance: He is well-developed and well-nourished.  HENT:      Head: Normocephalic and atraumatic.  Eyes:     Conjunctiva/sclera: Conjunctivae normal.  Cardiovascular:     Rate and Rhythm: Normal rate and regular rhythm.     Heart sounds: No murmur heard.   Pulmonary:     Effort: Pulmonary effort is normal. No respiratory distress.     Breath sounds: Normal breath sounds.  Abdominal:     General: Bowel sounds are normal. There is distension.     Palpations: Abdomen is soft.     Tenderness: There is abdominal tenderness.  Musculoskeletal:        General: No edema.     Cervical back: Neck supple.  Skin:    General: Skin is warm and dry.  Neurological:     General: No focal deficit present.     Mental Status: He is alert.  Psychiatric:        Mood and Affect: Mood and affect and mood normal.     ED Results / Procedures / Treatments   Labs (all labs ordered are listed, but only abnormal results are displayed) Labs Reviewed  CBC WITH DIFFERENTIAL/PLATELET - Abnormal; Notable for the following components:      Result Value   WBC 11.1 (*)    RBC 5.99 (*)    Hemoglobin 17.6 (*)    All other components within normal limits  COMPREHENSIVE METABOLIC PANEL - Abnormal; Notable for the following components:   Sodium 132 (*)    Chloride 95 (*)    Glucose, Bld 300 (*)    Alkaline Phosphatase 133 (*)    All other components within normal limits  URINALYSIS, ROUTINE W REFLEX MICROSCOPIC - Abnormal; Notable for the following components:   Glucose, UA 250 (*)    All other components within normal limits  LIPASE, BLOOD    EKG None  Radiology CT ABDOMEN PELVIS W CONTRAST  Result Date: 11/26/2020 CLINICAL DATA:  Abdominal pain and vomiting for 2 weeks. Question bowel obstruction. EXAM: CT ABDOMEN AND PELVIS WITH CONTRAST TECHNIQUE: Multidetector CT imaging of the abdomen and pelvis was performed using the standard protocol following bolus administration of intravenous contrast. CONTRAST:  100 ML OMNIPAQUE IOHEXOL 300 MG/ML  SOLN COMPARISON:   CT abdomen and pelvis 06/09/2018. FINDINGS: Lower chest: Mild dependent atelectasis in the lung bases. Calcific coronary artery disease. Heart size is mildly enlarged. No pleural or pericardial effusion. Hepatobiliary: No focal liver abnormality is seen. No gallstones, gallbladder wall thickening, or biliary dilatation. Pancreas: Unremarkable. No pancreatic ductal dilatation or surrounding inflammatory changes. Spleen: Normal in size without focal abnormality. Adrenals/Urinary Tract: The adrenal glands appear normal. Small bilateral renal cysts are unchanged. No urinary tract stones or hydronephrosis. Urinary bladder is negative. Stomach/Bowel: Stomach is within normal limits. Appendix appears normal. No evidence of bowel wall thickening, distention, or inflammatory changes. Vascular/Lymphatic: No significant vascular findings are present. No enlarged abdominal or pelvic lymph nodes. Reproductive: Prostate  is unremarkable. Other: None. Musculoskeletal: No acute or focal abnormality. Lower lumbar degenerative change noted. IMPRESSION: Negative for bowel obstruction. No acute abnormality or finding to explain the patient's symptoms. Calcific coronary artery disease. Lumbar spondylosis. Electronically Signed   By: Drusilla Kanner M.D.   On: 11/26/2020 14:47    Procedures Procedures (including critical care time)  Medications Ordered in ED Medications  ondansetron (ZOFRAN) injection 4 mg (4 mg Intravenous Given 11/26/20 1326)  iohexol (OMNIPAQUE) 300 MG/ML solution 100 mL (100 mLs Intravenous Contrast Given 11/26/20 1417)    ED Course  I have reviewed the triage vital signs and the nursing notes.  Pertinent labs & imaging results that were available during my care of the patient were reviewed by me and considered in my medical decision making (see chart for details).    MDM Rules/Calculators/A&P                          MDM:  Ct scan is normal.  I suspect some constipation.  I will try miralax to  see if this helps.  Pt advised to schedule to see his GI doctor for evaluation.  Final Clinical Impression(s) / ED Diagnoses Final diagnoses:  Generalized abdominal pain    Rx / DC Orders ED Discharge Orders         Ordered    polyethylene glycol (MIRALAX) 17 g packet  Daily        11/26/20 1525        An After Visit Summary was printed and given to the patient.    Elson Areas, PA-C 11/27/20 1407    Melene Plan, DO 11/27/20 1456

## 2021-08-29 ENCOUNTER — Other Ambulatory Visit: Payer: Self-pay | Admitting: Family Medicine

## 2022-02-26 ENCOUNTER — Telehealth: Payer: Self-pay | Admitting: Hematology and Oncology

## 2022-02-26 NOTE — Progress Notes (Signed)
[facility] ?CONSULT NOTE ? ?Patient Care Team: ?Jonathon Jordan, MD as PCP - General (Family Medicine) ? ?CHIEF COMPLAINTS/PURPOSE OF CONSULTATION: Polycythemia ? ?HISTORY OF PRESENTING ILLNESS:  ?[name] 66 y.o. male is here because of recent diagnosis of Polycythemia ?He presents to the clinic today for evaluation and discussion. ?Over the past several years he was noted to have hemoglobin that was between 15-18.  He has never had any problems or concerns.  He recently retired and appears to be focusing on his physical health and is going to the gym regularly.  He does not have any issues with chronic shortness of breath.  He denies having any lung or cardiac problems. ? ?I reviewed her records extensively and collaborated the history with the patient. ?  ? ? ?MEDICAL HISTORY:  ?Past Medical History:  ?Diagnosis Date  ? COVID-19   ? Diabetes mellitus without complication ([place])   ? H/O seasonal allergies   ? Hypertension   ? Spinal stenosis of lumbar region   ? ? ?SURGICAL HISTORY: ?Past Surgical History:  ?Procedure Laterality Date  ? COLONOSCOPY W/ POLYPECTOMY    ? LUMBAR LAMINECTOMY/DECOMPRESSION MICRODISCECTOMY N/A 04/06/2013  ? Procedure: LUMBAR LAMINECTOMY/DECOMPRESSION MICRODISCECTOMY L4-L5;  Surgeon: Johnn Hai, MD;  Location: [facility];  Service: Orthopedics;  Laterality: N/A;  ? ? ?SOCIAL HISTORY: ?Social History  ? ?Socioeconomic History  ? Marital status: Married  ?  Spouse name: Not on file  ? Number of children: Not on file  ? Years of education: Not on file  ? Highest education level: Not on file  ?Occupational History  ? Not on file  ?Tobacco Use  ? Smoking status: Never  ? Smokeless tobacco: Never  ?Substance and Sexual Activity  ? Alcohol use: No  ? Drug use: No  ? Sexual activity: Yes  ?Other Topics Concern  ? Not on file  ?Social History Narrative  ? Not on file  ? ?Social Determinants of Health  ? ?Financial Resource Strain: Not on file  ?Food Insecurity: Not on file   ?Transportation Needs: Not on file  ?Physical Activity: Not on file  ?Stress: Not on file  ?Social Connections: Not on file  ?Intimate Partner Violence: Not on file  ? ? ?FAMILY HISTORY: ?No family history on file. ? ?ALLERGIES:  is allergic to fenofibrate, lisinopril, metformin, and other. ? ?MEDICATIONS:  ?Current Outpatient Medications  ?Medication Sig Dispense Refill  ? allopurinol (ZYLOPRIM) 300 MG tablet Take 150-300 mg by mouth daily before breakfast.     ? atenolol-chlorthalidone (TENORETIC) 100-25 MG tablet Take 0.5 tablets by mouth daily.    ? cetirizine (ZYRTEC) 10 MG tablet Take 10 mg by mouth daily.    ? dexlansoprazole (DEXILANT) 60 MG capsule Take 60 mg by mouth daily.    ? insulin aspart (NOVOLOG FLEXPEN) 100 UNIT/ML FlexPen Inject 2-10 Units into the skin 3 (three) times daily with meals.    ? Insulin Glargine (BASAGLAR KWIKPEN) 100 UNIT/ML SOPN Inject 30 Units into the skin daily.    ? lipase/protease/amylase (CREON) 12000-38000 units CPEP capsule Take by mouth.    ? Multiple Vitamin (MULTIVITAMIN WITH MINERALS) TABS tablet Take 1 tablet by mouth daily.    ? Omega-3 Fatty Acids (FISH OIL PO) Take by mouth.    ? polyethylene glycol (MIRALAX) 17 g packet Take 17 g by mouth daily. 14 each 0  ? rosuvastatin (CRESTOR) 5 MG tablet Take 5 mg by mouth every 3 (three) days.    ? ?No  current facility-administered medications for this visit.  ? ? ?REVIEW OF SYSTEMS:   ?  ?All other systems were reviewed with the patient and are negative. ? ?PHYSICAL EXAMINATION: ?ECOG PERFORMANCE STATUS: 1 - Symptomatic but completely ambulatory ? ?Vitals:  ? 02/28/22 1432  ?BP: (!) 149/77  ?Pulse: 85  ?Resp: 17  ?Temp: 97.8 ?F (36.6 ?C)  ?SpO2: 98%  ? ?Filed Weights  ? 02/28/22 1432  ?Weight: 250 lb 12.8 oz (113.8 kg)  ? ?  ? ?LABORATORY DATA:  ?I have reviewed the data as listed ?Lab Results  ?Component Value Date  ? WBC 11.1 (H) 11/26/2020  ? HGB 17.6 (H) 11/26/2020  ? HCT 50.8 11/26/2020  ? MCV 84.8 11/26/2020  ? PLT 284  11/26/2020  ? ?Lab Results  ?Component Value Date  ? NA 132 (L) 11/26/2020  ? K 4.3 11/26/2020  ? CL 95 (L) 11/26/2020  ? CO2 27 11/26/2020  ? ? ?RADIOGRAPHIC STUDIES: ?I have personally reviewed the radiological reports and agreed with the findings in the report. ? ?ASSESSMENT AND PLAN:  ?Polycythemia, secondary ?Lab review: ?11/25/2019: Hemoglobin 16.5 ?11/26/2020: Hemoglobin 17.6 ?02/11/2021: Hemoglobin 15.9, hematocrit 47 ?02/17/2022: Hemoglobin 18.3 with hematocrit 53.5 ? ?I discussed with the patient extensively the differential diagnosis of polycythemia ?1. Primary polycythemia due to clonal stem cell abnormality ?2. Secondary polycythemia due to cause that include hypoxia, heart or lung problems, altitude, athletics, erythropoietin producing lesion/tumors etc ? ?Recommendation: ?1. JAK-2 mutation testing to evaluate polycythemia vera ?2. Erythropoietin level ?3.  Sleep study to rule out sleep apnea ? ?Indications for phlebotomy ?1. Primary polycythemia with hematocrit over 55 ?2. Secondary polycythemia with severe symptoms which include strokelike symptoms, severe recurrent headaches, severe fatigue.  If the sleep study shows sleep apnea then we will treat the underlying cause and that should make his hemoglobin better. ? ?Patient does not have any clear-cut symptoms that would require phlebotomy at this time. ?  ?Telephone visit to discuss results ? ? ?All questions were answered. The patient knows to call the clinic with any problems, questions or concerns. ?  ? Harriette Ohara, MD ?02/28/22 ? I Gardiner Coins, am acting as a scribe for Dr. Lindi Adie  ?

## 2022-02-26 NOTE — Telephone Encounter (Signed)
Scheduled appt per 3/15 referral. Pt is aware of appt date and time. Pt is aware to arrive 15 mins prior to appt time and to bring and updated insurance card. Pt is aware of appt location.   ?

## 2022-02-28 ENCOUNTER — Inpatient Hospital Stay: Payer: BC Managed Care – PPO | Attending: Hematology and Oncology | Admitting: Hematology and Oncology

## 2022-02-28 ENCOUNTER — Inpatient Hospital Stay: Payer: BC Managed Care – PPO

## 2022-02-28 ENCOUNTER — Telehealth: Payer: Self-pay | Admitting: Hematology and Oncology

## 2022-02-28 ENCOUNTER — Other Ambulatory Visit: Payer: Self-pay

## 2022-02-28 DIAGNOSIS — D751 Secondary polycythemia: Secondary | ICD-10-CM | POA: Insufficient documentation

## 2022-02-28 LAB — CBC WITH DIFFERENTIAL (CANCER CENTER ONLY)
Abs Immature Granulocytes: 0.05 10*3/uL (ref 0.00–0.07)
Basophils Absolute: 0.1 10*3/uL (ref 0.0–0.1)
Basophils Relative: 1 %
Eosinophils Absolute: 0.3 10*3/uL (ref 0.0–0.5)
Eosinophils Relative: 4 %
HCT: 57.5 % — ABNORMAL HIGH (ref 39.0–52.0)
Hemoglobin: 19.5 g/dL — ABNORMAL HIGH (ref 13.0–17.0)
Immature Granulocytes: 1 %
Lymphocytes Relative: 29 %
Lymphs Abs: 2.6 10*3/uL (ref 0.7–4.0)
MCH: 29.4 pg (ref 26.0–34.0)
MCHC: 33.9 g/dL (ref 30.0–36.0)
MCV: 86.7 fL (ref 80.0–100.0)
Monocytes Absolute: 0.8 10*3/uL (ref 0.1–1.0)
Monocytes Relative: 9 %
Neutro Abs: 5.2 10*3/uL (ref 1.7–7.7)
Neutrophils Relative %: 56 %
Platelet Count: 236 10*3/uL (ref 150–400)
RBC: 6.63 MIL/uL — ABNORMAL HIGH (ref 4.22–5.81)
RDW: 13.9 % (ref 11.5–15.5)
WBC Count: 9 10*3/uL (ref 4.0–10.5)
nRBC: 0 % (ref 0.0–0.2)

## 2022-02-28 NOTE — Assessment & Plan Note (Signed)
Lab review: ?11/25/2019: Hemoglobin 16.5 ?11/26/2020: Hemoglobin 17.6 ?02/11/2021: Hemoglobin 15.9, hematocrit 47 ?02/17/2022: Hemoglobin 18.3 with hematocrit 53.5 ? ?I discussed with the patient extensively the differential diagnosis of polycythemia ?1. Primary polycythemia due to clonal stem cell abnormality ?2. Secondary polycythemia due to cause that include hypoxia, heart or lung problems, altitude, athletics, erythropoietin producing lesion/tumors etc ? ?Recommendation: ?1. JAK-2 mutation testing to evaluate polycythemia vera ?2. Erythropoietin level ? ?Indications for phlebotomy ?1. Primary polycythemia with hematocrit over 55 ?2. Secondary polycythemia with severe symptoms which include strokelike symptoms, severe recurrent headaches, severe fatigue. ? ?Patient does not have any clear-cut symptoms that would require phlebotomy at this time. ?If the JAK-2 mutation is normal and erythropoietin level is normal, a bone marrow biopsy may be considered. If the erythropoietin is elevated, he will need ultrasound of the liver and kidney for further evaluation.  ? ?I provided patient written information on polycythemia.   ? ? ?

## 2022-02-28 NOTE — Telephone Encounter (Signed)
Patient's hemoglobin was 19.5 ?I informed the patient about the high hemoglobin and encouraged the patient to immediately donate a unit of blood at [facility]. ?Also recommended drinking plenty of fluids. ?The rest of the blood work comes back we will discuss next week as to the next steps in the treatment plan. ?

## 2022-03-02 LAB — ERYTHROPOIETIN: Erythropoietin: 21 m[IU]/mL — ABNORMAL HIGH (ref 2.6–18.5)

## 2022-03-06 LAB — JAK2 (INCLUDING V617F AND EXON 12), MPL,& CALR W/RFL MPN PANEL (NGS)

## 2022-03-13 NOTE — Progress Notes (Signed)
HEMATOLOGY-ONCOLOGY TELEPHONE VISIT PROGRESS NOTE ? ?I connected with @PTNAME @ on 03/14/22 at 11:15 AM EDT by telephone and verified that I am speaking with the correct person using two identifiers.  ?I discussed the limitations, risks, security and privacy concerns of performing an evaluation and management service by telephone and the availability of in person appointments.  ?I also discussed with the patient that there may be a patient responsible charge related to this service. The patient expressed understanding and agreed to proceed.  ? ?CHIEF COMPLAINTS/PURPOSE OF CONSULTATION: Polycythemia ? ?History of Present Illness: [name] 66 y.o. male is here because of recent diagnosis of Polycythemia ?He presents to the clinic today for via telephone follow-up ? ?REVIEW OF SYSTEMS:   ?Constitutional: Denies fevers, chills or abnormal weight loss ?  ?All other systems were reviewed with the patient and are negative. ?Observations/Objective:  ? ?  ?Assessment Plan:  ?Polycythemia, secondary ?Lab review: ?11/25/2019: Hemoglobin 16.5 ?11/26/2020: Hemoglobin 17.6 ?02/11/2021: Hemoglobin 15.9, hematocrit 47 ?02/17/2022: Hemoglobin 18.3 with hematocrit 53.5 ?02/28/2022: Hemoglobin 19.5, hematocrit 57.5 (3/25: given 1 unit) ?MPN panel: Negative for relevant mutations like JAK2, VUS (TET2) ?Erythropoietin: 21 ? ?Was taking Neugenix (testosterone): He now stopped it. ? ?Because of the slight elevation of erythropoietin level I recommend that we obtain an ultrasound of the liver and spleen. ?Plan: Phlebotomy for hematocrit greater than 55 ? ? ?I discussed the assessment and treatment plan with the patient. The patient was provided an opportunity to ask questions and all were answered. The patient agreed with the plan and demonstrated an understanding of the instructions. The patient was advised to call back or seek an in-person evaluation if the symptoms worsen or if the condition fails to improve as anticipated.  ? ?I  provided 15 minutes of non-face-to-face time during this encounter. 09-03-1985, MD   ? ?I Tamsen Meek am scribing for Dr. Janan Ridge ? ?I have reviewed the above documentation for accuracy and completeness, and I agree with the above. ?  ?

## 2022-03-14 ENCOUNTER — Inpatient Hospital Stay (HOSPITAL_BASED_OUTPATIENT_CLINIC_OR_DEPARTMENT_OTHER): Payer: BC Managed Care – PPO | Admitting: Hematology and Oncology

## 2022-03-14 DIAGNOSIS — D751 Secondary polycythemia: Secondary | ICD-10-CM

## 2022-03-14 NOTE — Assessment & Plan Note (Signed)
Lab review: ?11/25/2019: Hemoglobin 16.5 ?11/26/2020: Hemoglobin 17.6 ?02/11/2021: Hemoglobin 15.9, hematocrit 47 ?02/17/2022: Hemoglobin 18.3 with hematocrit 53.5 ?02/28/2022: Hemoglobin 19.5, hematocrit 57.5 ?MPN panel: Negative for relevant mutations like JAK2, VUS (TET2) ?Erythropoietin: 21 ? ?Because of the slight elevation of erythropoietin level I recommend that we obtain an ultrasound of the liver and spleen. ?Plan: Phlebotomy for hematocrit greater than 55 ?

## 2022-03-19 ENCOUNTER — Other Ambulatory Visit: Payer: Self-pay | Admitting: *Deleted

## 2022-03-19 DIAGNOSIS — D751 Secondary polycythemia: Secondary | ICD-10-CM

## 2022-03-19 NOTE — Progress Notes (Signed)
Received call from pt stating he received a letter from the [facility] that mentioned he tested positive for Hemoglobin S.  Reviewed with MD and verbal orders received for pt to undergo Hgb Electrophoresis lab testing at our facility.  Orders placed, appt scheduled and pt verbalized understanding of appt date and time.  ?

## 2022-05-08 IMAGING — CT CT ABD-PELV W/ CM
2 of 5 series · 16 of 46 positions shown, 18 images · IV contrast (Omnipaque)
Comparison: CT abdomen and pelvis 06/09/2018.

CLINICAL DATA: Abdominal pain and vomiting for 2 weeks. Question
bowel obstruction.

EXAM:
CT ABDOMEN AND PELVIS WITH CONTRAST
TECHNIQUE: Multidetector CT imaging of the abdomen and pelvis was performed
using the standard protocol following bolus administration of
intravenous contrast.
CONTRAST:  100 ML OMNIPAQUE IOHEXOL 300 MG/ML  SOLN

[Series 2: axial st · axial · 0.98mm/px · z∈[-505,-15]mm · 13 of 110 slices shown, 15 images]
[im 6/110  soft-tissue]
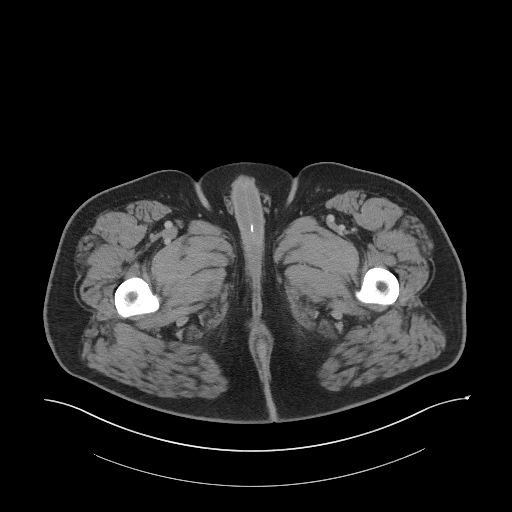
[im 6/110  bone]
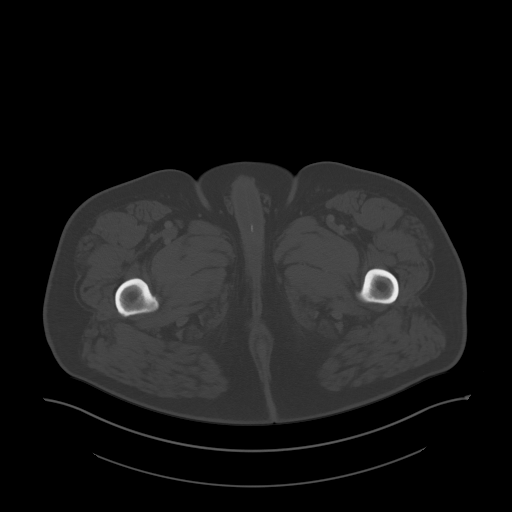
[im 18/110  soft-tissue]
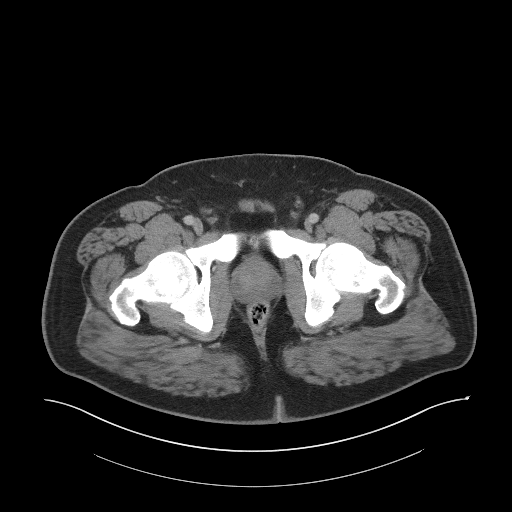
[im 23/110  soft-tissue]
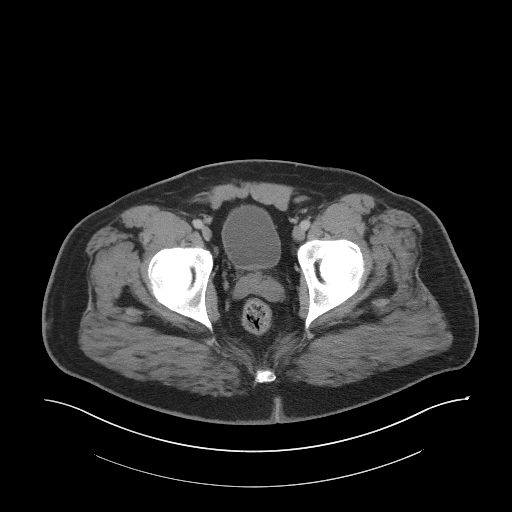
[im 29/110  soft-tissue]
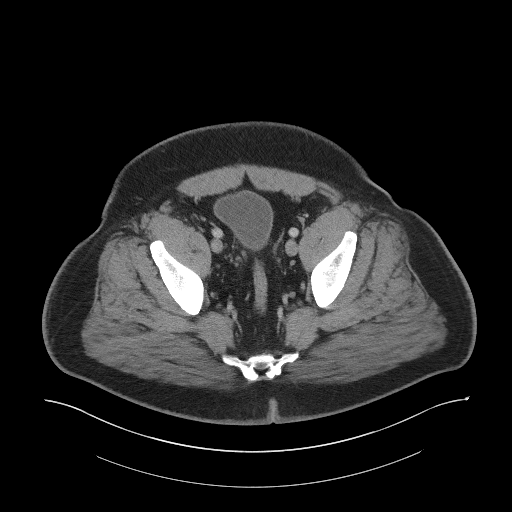
[im 41/110  soft-tissue]
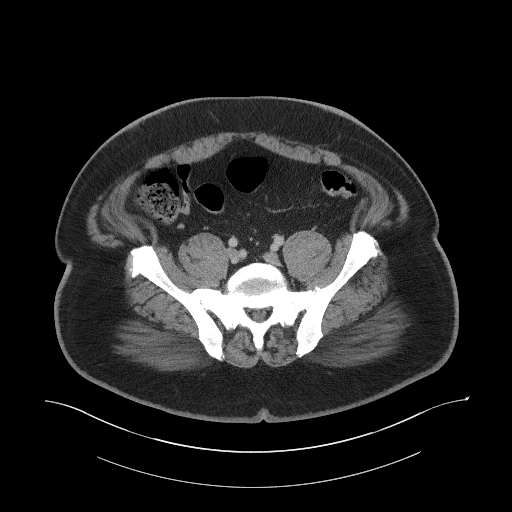
[im 46/110  soft-tissue]
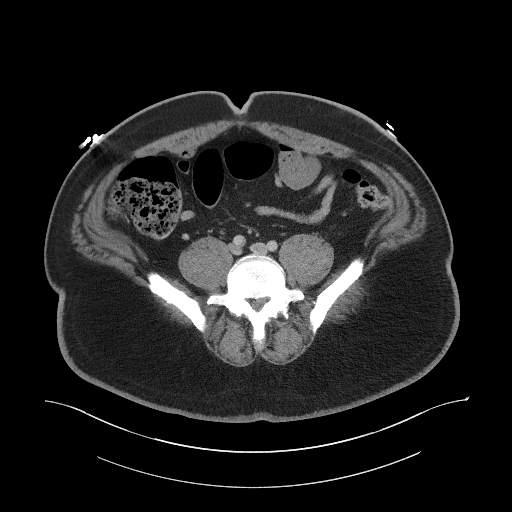
[im 58/110  soft-tissue]
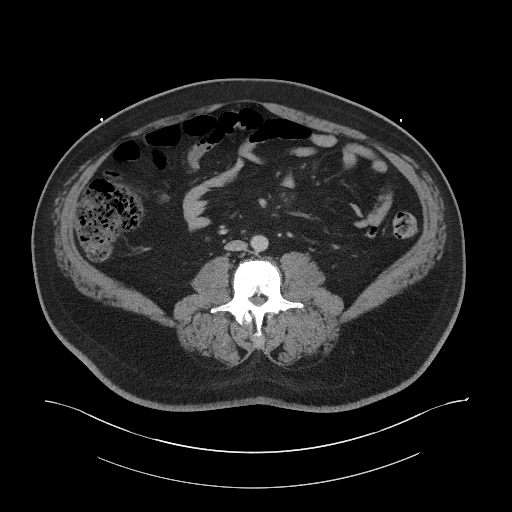
[im 64/110  soft-tissue]
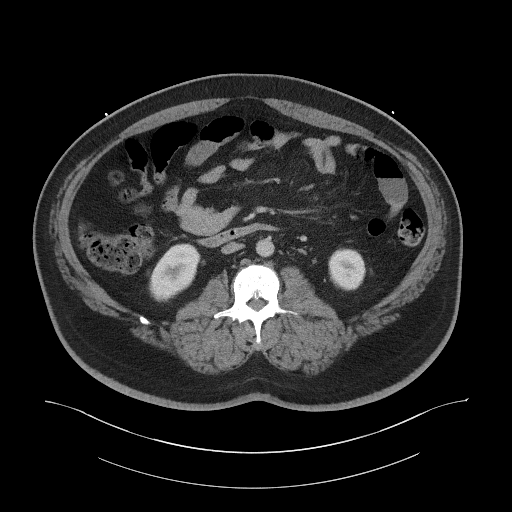
[im 69/110  soft-tissue]
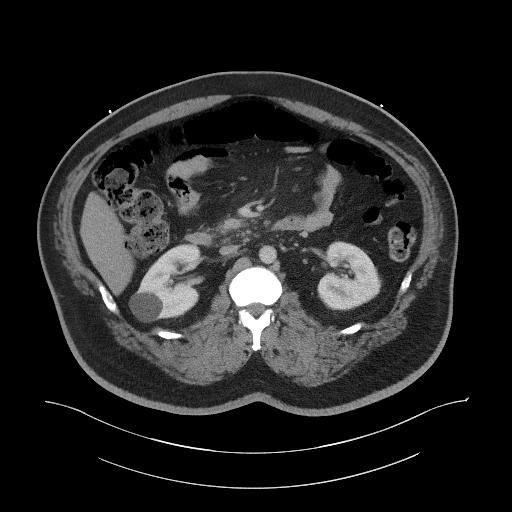
[im 69/110  bone]
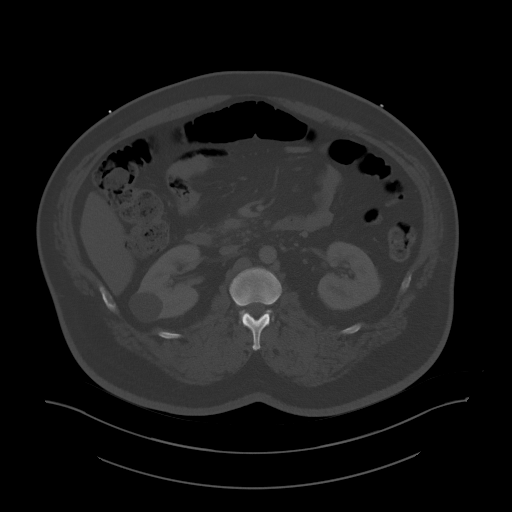
[im 81/110  soft-tissue]
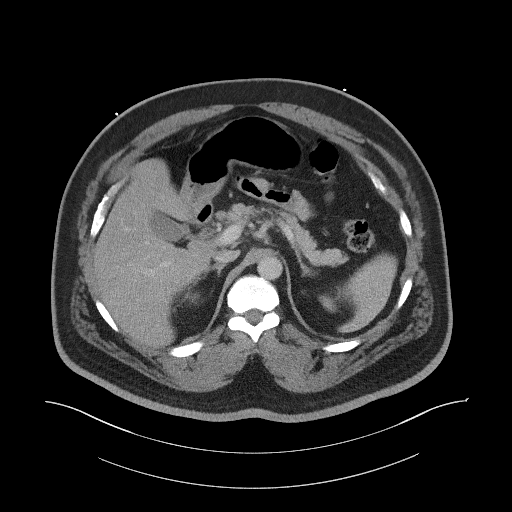
[im 87/110  soft-tissue]
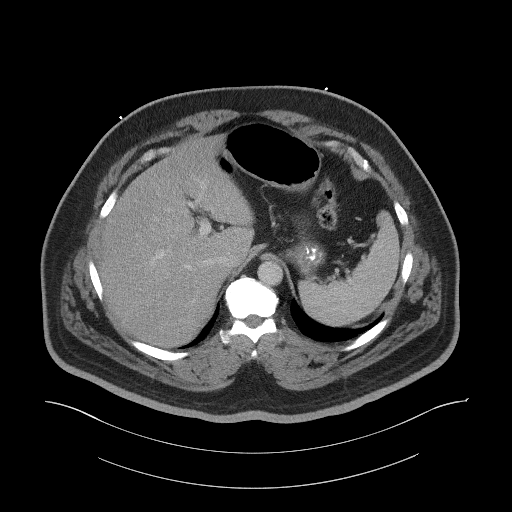
[im 92/110  soft-tissue]
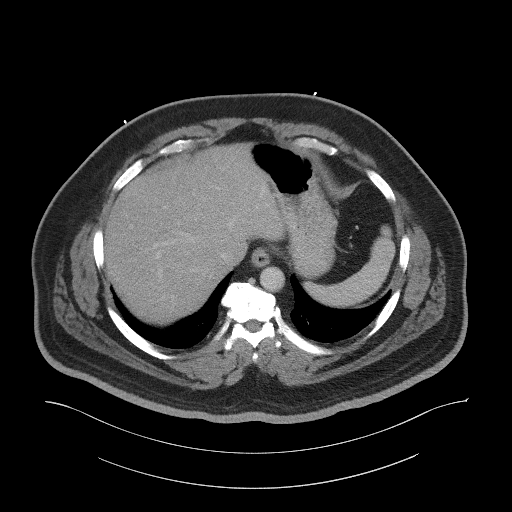
[im 104/110  soft-tissue]
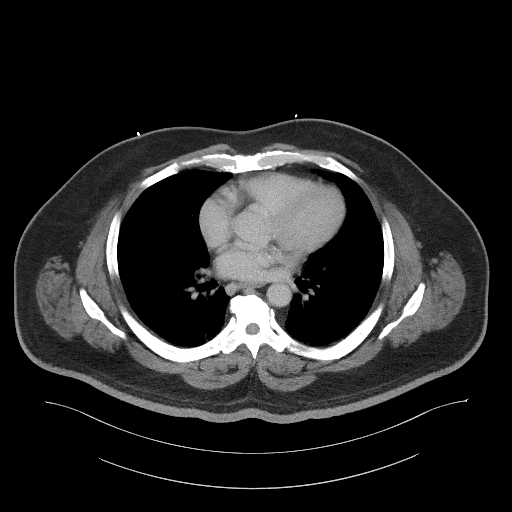

[Series 5: coronal st · coronal · 0.93mm/px · 3 of 117 slices shown]
[im 39/117  soft-tissue]
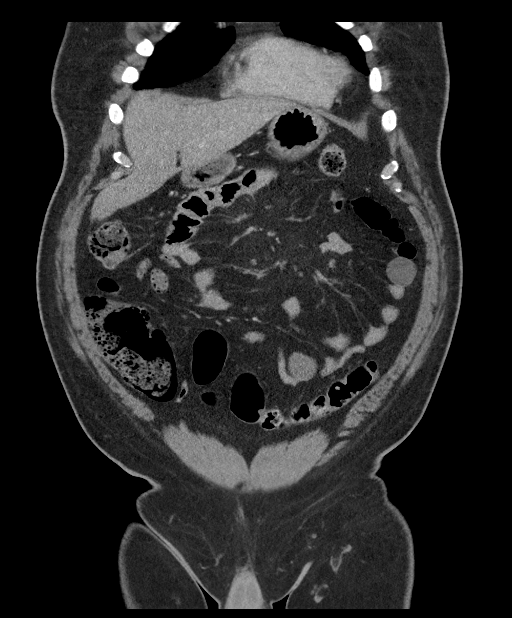
[im 52/117  soft-tissue]
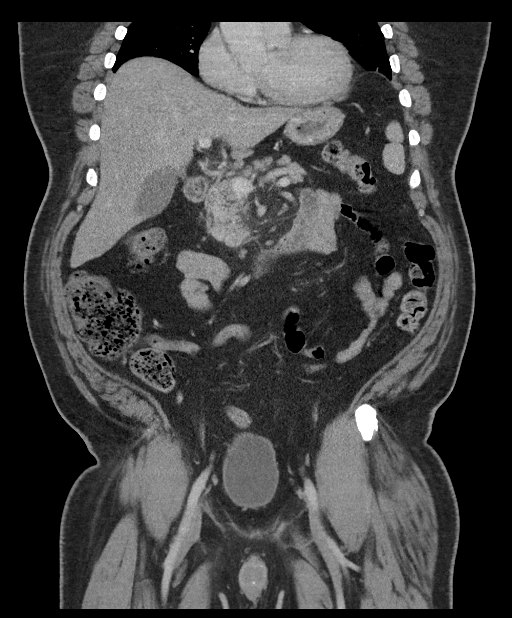
[im 65/117  soft-tissue]
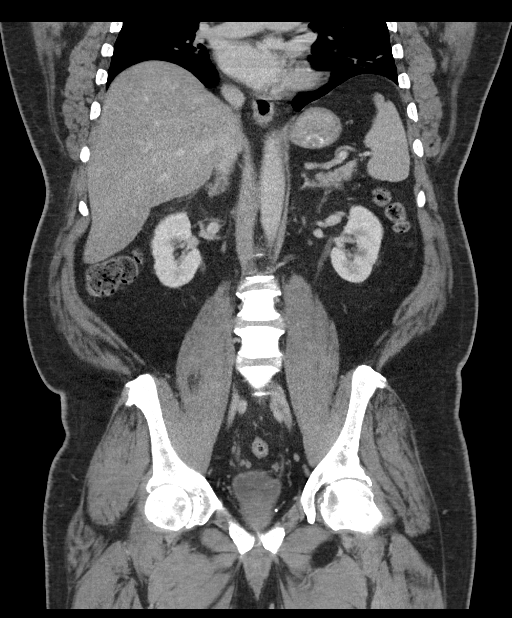

[16 of 46 positions shown; findings below may reference images not displayed]

FINDINGS: Lower chest: Mild dependent atelectasis in the lung bases. Calcific
coronary artery disease. Heart size is mildly enlarged. No pleural
or pericardial effusion.

Hepatobiliary: No focal liver abnormality is seen. No gallstones,
gallbladder wall thickening, or biliary dilatation.

Pancreas: Unremarkable. No pancreatic ductal dilatation or
surrounding inflammatory changes.

Spleen: Normal in size without focal abnormality.

Adrenals/Urinary Tract: The adrenal glands appear normal. Small
bilateral renal cysts are unchanged. No urinary tract stones or
hydronephrosis. Urinary bladder is negative.

Stomach/Bowel: Stomach is within normal limits. Appendix appears
normal. No evidence of bowel wall thickening, distention, or
inflammatory changes.

Vascular/Lymphatic: No significant vascular findings are present. No
enlarged abdominal or pelvic lymph nodes.

Reproductive: Prostate is unremarkable.

Other: None.

Musculoskeletal: No acute or focal abnormality. Lower lumbar
degenerative change noted.
IMPRESSION: Negative for bowel obstruction. No acute abnormality or finding to
explain the patient's symptoms.

Calcific coronary artery disease.

Lumbar spondylosis.

## 2022-05-21 ENCOUNTER — Telehealth: Payer: Self-pay | Admitting: Hematology and Oncology

## 2022-05-21 NOTE — Telephone Encounter (Signed)
Rescheduled appointment per provider PAL. Left message. 

## 2022-06-02 ENCOUNTER — Inpatient Hospital Stay: Payer: BC Managed Care – PPO | Attending: Hematology and Oncology

## 2022-06-02 ENCOUNTER — Other Ambulatory Visit: Payer: Self-pay

## 2022-06-02 ENCOUNTER — Other Ambulatory Visit: Payer: Self-pay | Admitting: Lab

## 2022-06-02 DIAGNOSIS — D751 Secondary polycythemia: Secondary | ICD-10-CM

## 2022-06-02 LAB — CBC WITH DIFFERENTIAL (CANCER CENTER ONLY)
Abs Immature Granulocytes: 0.03 10*3/uL (ref 0.00–0.07)
Basophils Absolute: 0.1 10*3/uL (ref 0.0–0.1)
Basophils Relative: 1 %
Eosinophils Absolute: 0.4 10*3/uL (ref 0.0–0.5)
Eosinophils Relative: 5 %
HCT: 50.8 % (ref 39.0–52.0)
Hemoglobin: 16.9 g/dL (ref 13.0–17.0)
Immature Granulocytes: 0 %
Lymphocytes Relative: 29 %
Lymphs Abs: 2.2 10*3/uL (ref 0.7–4.0)
MCH: 27.6 pg (ref 26.0–34.0)
MCHC: 33.3 g/dL (ref 30.0–36.0)
MCV: 82.9 fL (ref 80.0–100.0)
Monocytes Absolute: 0.7 10*3/uL (ref 0.1–1.0)
Monocytes Relative: 10 %
Neutro Abs: 4.2 10*3/uL (ref 1.7–7.7)
Neutrophils Relative %: 55 %
Platelet Count: 266 10*3/uL (ref 150–400)
RBC: 6.13 MIL/uL — ABNORMAL HIGH (ref 4.22–5.81)
RDW: 14.2 % (ref 11.5–15.5)
WBC Count: 7.5 10*3/uL (ref 4.0–10.5)
nRBC: 0 % (ref 0.0–0.2)

## 2022-06-05 LAB — HGB FRACTIONATION CASCADE
Hgb A2: 3.3 % — ABNORMAL HIGH (ref 1.8–3.2)
Hgb A: 55.5 % — ABNORMAL LOW (ref 96.4–98.8)
Hgb F: 1 % (ref 0.0–2.0)
Hgb S: 40.2 % — ABNORMAL HIGH

## 2022-06-05 LAB — HGB SOLUBILITY: Hgb Solubility: POSITIVE — AB

## 2022-06-12 ENCOUNTER — Telehealth: Payer: Self-pay | Admitting: *Deleted

## 2022-06-12 NOTE — Telephone Encounter (Signed)
Received call from pt spouse requesting to cancel upcoming abd [place] appt stating pt cost over $900.  Pt spouse states she will contact their insurance to preferred imaging location and will update our office to place new order for location to be completed.

## 2022-06-12 NOTE — Progress Notes (Signed)
Patient Care Team: [name], MD as PCP - General (Family Medicine)  DIAGNOSIS:  Encounter Diagnosis  Name Primary?   Polycythemia, secondary      CHIEF COMPLIANT:  Polycythemia  INTERVAL HISTORY: [name] is a 66 y.o. male is here because of recent diagnosis of Polycythemia He presents to the clinic today for follow-up. He states that he stopped the Testosterone and it helped with his hemoglobin levels. He had work-up for hemoglobin after pheresis and it detected a sickle cell variant.  He does not have any symptoms of sickle cell disease.  He had an abdominal ultrasound for evaluation of elevated erythropoietin and it detected renal cysts.   ALLERGIES:  is allergic to fenofibrate, lisinopril, metformin, and other.  MEDICATIONS:  Current Outpatient Medications  Medication Sig Dispense Refill   allopurinol (ZYLOPRIM) 300 MG tablet Take 150-300 mg by mouth daily before breakfast.      atenolol-chlorthalidone (TENORETIC) 100-25 MG tablet Take 0.5 tablets by mouth daily.     cetirizine (ZYRTEC) 10 MG tablet Take 10 mg by mouth daily.     dexlansoprazole (DEXILANT) 60 MG capsule Take 60 mg by mouth daily.     insulin aspart (NOVOLOG FLEXPEN) 100 UNIT/ML FlexPen Inject 2-10 Units into the skin 3 (three) times daily with meals.     Insulin Glargine (BASAGLAR KWIKPEN) 100 UNIT/ML SOPN Inject 30 Units into the skin daily.     lipase/protease/amylase (CREON) 12000-38000 units CPEP capsule Take by mouth.     Multiple Vitamin (MULTIVITAMIN WITH MINERALS) TABS tablet Take 1 tablet by mouth daily.     Omega-3 Fatty Acids (FISH OIL PO) Take by mouth.     polyethylene glycol (MIRALAX) 17 g packet Take 17 g by mouth daily. 14 each 0   rosuvastatin (CRESTOR) 5 MG tablet Take 5 mg by mouth every 3 (three) days.     No current facility-administered medications for this visit.    PHYSICAL EXAMINATION: ECOG PERFORMANCE STATUS: 1 - Symptomatic but completely ambulatory  Vitals:    06/26/22 0824  BP: (!) 141/88  Pulse: 68  Resp: 18  Temp: (!) 97.2 F (36.2 C)  SpO2: 98%   Filed Weights   06/26/22 0824  Weight: 254 lb (115.2 kg)      LABORATORY DATA:  I have reviewed the data as listed    Latest Ref Rng & Units 11/26/2020    1:19 PM 11/26/2019   12:23 AM 11/25/2019    2:10 AM  CMP  Glucose 70 - 99 mg/dL 295  188  96   BUN 8 - 23 mg/dL 20  35  37   Creatinine 0.61 - 1.24 mg/dL 4.16  6.06  3.01   Sodium 135 - 145 mmol/L 132  137  136   Potassium 3.5 - 5.1 mmol/L 4.3  3.8  3.1   Chloride 98 - 111 mmol/L 95  99  95   CO2 22 - 32 mmol/L 27  26  27    Calcium 8.9 - 10.3 mg/dL 9.8  9.1  8.8   Total Protein 6.5 - 8.1 g/dL 7.9  7.3  7.5   Total Bilirubin 0.3 - 1.2 mg/dL 0.8  1.3  1.3   Alkaline Phos 38 - 126 U/L 133  80  89   AST 15 - 41 U/L 24  35  52   ALT 0 - 44 U/L 39  55  61     Lab Results  Component Value Date   WBC 7.5 06/02/2022  HGB 16.9 06/02/2022   HCT 50.8 06/02/2022   MCV 82.9 06/02/2022   PLT 266 06/02/2022   NEUTROABS 4.2 06/02/2022    ASSESSMENT & PLAN:  Polycythemia, secondary Lab review: 11/25/2019: Hemoglobin 16.5 11/26/2020: Hemoglobin 17.6 02/11/2021: Hemoglobin 15.9, hematocrit 47 02/17/2022: Hemoglobin 18.3 with hematocrit 53.5 02/28/2022: Hemoglobin 19.5, hematocrit 57.5 (3/25: given 1 unit) 06/02/2022: Hemoglobin 16.9 (improved after stopping testosterone)  MPN panel: Negative for relevant mutations like JAK2, VUS (TET2) Erythropoietin: 21 06/19/22: U/S abd: 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  Unchanged from before.  I will refer the patient to urology.   Was taking Neugenix (testosterone): He now stopped it.    Plan: Phlebotomy for hematocrit greater than 55 Return to clinic on an as-needed basis   No orders of the defined types were placed in this encounter.  The patient has a good understanding of the overall plan. he agrees with it. he will call with any problems that may develop before the next visit  here. Total time spent: 30 mins including face to face time and time spent for planning, charting and co-ordination of care   Tamsen Meek, MD 06/26/22    I Janan Ridge am scribing for Dr. Pamelia Hoit  I have reviewed the above documentation for accuracy and completeness, and I agree with the above.

## 2022-06-13 ENCOUNTER — Encounter (HOSPITAL_COMMUNITY): Payer: Self-pay

## 2022-06-13 ENCOUNTER — Other Ambulatory Visit: Payer: Self-pay | Admitting: *Deleted

## 2022-06-13 ENCOUNTER — Ambulatory Visit (HOSPITAL_COMMUNITY): Payer: BC Managed Care – PPO

## 2022-06-13 DIAGNOSIS — D751 Secondary polycythemia: Secondary | ICD-10-CM

## 2022-06-13 NOTE — Progress Notes (Signed)
Per pt request order for US abdomen complete updated to preferred imaging location to be [facility].  Pt provided with number to call and schedule appt.

## 2022-06-16 ENCOUNTER — Ambulatory Visit: Payer: BC Managed Care – PPO | Admitting: Hematology and Oncology

## 2022-06-16 ENCOUNTER — Other Ambulatory Visit: Payer: BC Managed Care – PPO

## 2022-06-19 ENCOUNTER — Ambulatory Visit
Admission: RE | Admit: 2022-06-19 | Discharge: 2022-06-19 | Disposition: A | Discharge status: Home / Self Care | Payer: BC Managed Care – PPO | Source: Ambulatory Visit | Attending: Hematology and Oncology | Admitting: Hematology and Oncology

## 2022-06-19 DIAGNOSIS — D751 Secondary polycythemia: Secondary | ICD-10-CM

## 2022-06-25 NOTE — Assessment & Plan Note (Signed)
Lab review: 11/25/2019: Hemoglobin 16.5 11/26/2020: Hemoglobin 17.6 02/11/2021: Hemoglobin 15.9, hematocrit 47 02/17/2022: Hemoglobin 18.3 with hematocrit 53.5 02/28/2022: Hemoglobin 19.5, hematocrit 57.5 (3/25: given 1 unit) MPN panel: Negative for relevant mutations like JAK2, VUS (TET2) Erythropoietin: 21 06/19/22: U/S abd: Right renal lesion concerning for renal cell cancer, MRI recommended  Was taking Neugenix (testosterone): He now stopped it.   Plan: Phlebotomy for hematocrit greater than 55

## 2022-06-26 ENCOUNTER — Inpatient Hospital Stay: Payer: BC Managed Care – PPO | Attending: Hematology and Oncology | Admitting: Hematology and Oncology

## 2022-06-26 ENCOUNTER — Other Ambulatory Visit: Payer: Self-pay

## 2022-06-26 VITALS — BP 141/88 | HR 68 | Temp 97.2°F | Resp 18 | Ht 69.0 in | Wt 254.0 lb

## 2022-06-26 DIAGNOSIS — Q6102 Congenital multiple renal cysts: Secondary | ICD-10-CM

## 2022-06-26 DIAGNOSIS — D751 Secondary polycythemia: Secondary | ICD-10-CM | POA: Diagnosis present

## 2022-10-30 ENCOUNTER — Telehealth: Payer: Self-pay | Admitting: *Deleted

## 2022-10-30 DIAGNOSIS — D751 Secondary polycythemia: Secondary | ICD-10-CM

## 2022-10-30 NOTE — Telephone Encounter (Signed)
Received call from pt stating that [facility] will no long accept phlebotomy donations from pt due to his blood being "too thick".  Pt requesting office appt with MD to discuss other options. RN sent message to scheduling team to arrange lab, MD, and infusion for possible phlebotomy.  Pt educated and verbalized understanding.

## 2022-11-09 NOTE — Progress Notes (Signed)
Patient Care Team: [name], MD as PCP - General (Family Medicine)  DIAGNOSIS:  Encounter Diagnosis  Name Primary?   Polycythemia, secondary Yes     CHIEF COMPLIANT: Follow-up phlebotomy   INTERVAL HISTORY: [name] is a  66 y.o. male is here because of recent diagnosis of Polycythemia He presents to the clinic today for follow-up. He states he has been feeling good. Denies any headaches bleeding or any other symptoms. He says he is trying to drink plenty of water.   ALLERGIES:  is allergic to fenofibrate, lisinopril, metformin, and other.  MEDICATIONS:  Current Outpatient Medications  Medication Sig Dispense Refill   amoxicillin (AMOXIL) 875 MG tablet Take 875 mg by mouth 2 (two) times daily.     Continuous Blood Gluc Sensor (DEXCOM G6 SENSOR) MISC SMARTSIG:1 Each Topical Every 10 Days     esomeprazole (NEXIUM) 40 MG capsule Take 40 mg by mouth daily.     meloxicam (MOBIC) 15 MG tablet Take 15 mg by mouth daily.     allopurinol (ZYLOPRIM) 300 MG tablet Take 150-300 mg by mouth daily before breakfast.      atenolol-chlorthalidone (TENORETIC) 100-25 MG tablet Take 0.5 tablets by mouth daily.     cetirizine (ZYRTEC) 10 MG tablet Take 10 mg by mouth daily.     dexlansoprazole (DEXILANT) 60 MG capsule Take 60 mg by mouth daily.     insulin aspart (NOVOLOG FLEXPEN) 100 UNIT/ML FlexPen Inject 2-10 Units into the skin 3 (three) times daily with meals.     Insulin Glargine (BASAGLAR KWIKPEN) 100 UNIT/ML SOPN Inject 30 Units into the skin daily.     lipase/protease/amylase (CREON) 12000-38000 units CPEP capsule Take by mouth.     losartan (COZAAR) 25 MG tablet Take 12.5 mg by mouth daily.     Multiple Vitamin (MULTIVITAMIN WITH MINERALS) TABS tablet Take 1 tablet by mouth daily.     Omega-3 Fatty Acids (FISH OIL PO) Take by mouth.     polyethylene glycol (MIRALAX) 17 g packet Take 17 g by mouth daily. 14 each 0   rosuvastatin (CRESTOR) 5 MG tablet Take 5 mg by mouth every  3 (three) days.     No current facility-administered medications for this visit.    PHYSICAL EXAMINATION: ECOG PERFORMANCE STATUS: 1 - Symptomatic but completely ambulatory  Vitals:   11/14/22 1047  BP: 117/72  Pulse: 63  Resp: 18  Temp: 97.8 F (36.6 C)  SpO2: 98%   Filed Weights   11/14/22 1047  Weight: 255 lb 6.4 oz (115.8 kg)      LABORATORY DATA:  I have reviewed the data as listed    Latest Ref Rng & Units 11/26/2020    1:19 PM 11/26/2019   12:23 AM 11/25/2019    2:10 AM  CMP  Glucose 70 - 99 mg/dL 432  761  96   BUN 8 - 23 mg/dL 20  35  37   Creatinine 0.61 - 1.24 mg/dL 4.70  9.29  5.74   Sodium 135 - 145 mmol/L 132  137  136   Potassium 3.5 - 5.1 mmol/L 4.3  3.8  3.1   Chloride 98 - 111 mmol/L 95  99  95   CO2 22 - 32 mmol/L 27  26  27    Calcium 8.9 - 10.3 mg/dL 9.8  9.1  8.8   Total Protein 6.5 - 8.1 g/dL 7.9  7.3  7.5   Total Bilirubin 0.3 - 1.2 mg/dL 0.8  1.3  1.3   Alkaline Phos 38 - 126 U/L 133  80  89   AST 15 - 41 U/L 24  35  52   ALT 0 - 44 U/L 39  55  61     Lab Results  Component Value Date   WBC 8.3 11/14/2022   HGB 17.1 (H) 11/14/2022   HCT 53.0 (H) 11/14/2022   MCV 80.1 11/14/2022   PLT 289 11/14/2022   NEUTROABS 4.3 11/14/2022    ASSESSMENT & PLAN:  Polycythemia, secondary Lab review: 11/25/2019: Hemoglobin 16.5 11/26/2020: Hemoglobin 17.6 02/11/2021: Hemoglobin 15.9, hematocrit 47 02/17/2022: Hemoglobin 18.3 with hematocrit 53.5 02/28/2022: Hemoglobin 19.5, hematocrit 57.5 (3/25: given 1 unit) 06/02/2022: Hemoglobin 16.9 (improved after stopping testosterone) 11/14/2022: Hemoglobin 17.1, hematocrit 53   MPN panel: Negative for relevant mutations like JAK2, VUS (TET2) Erythropoietin: 21  06/19/22: U/S abd: 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  Unchanged from before.  Seeing urology  Was taking Neugenix (testosterone): He now stopped it. [facility] stopped accepting his blood because he has sickle cell trait.  Also  because his blood was too thick.   Plan: Phlebotomy for hematocrit greater than 55 He does not need phlebotomy today.  Return to clinic in 6 months with labs and phlebotomy if needed.    Orders Placed This Encounter  Procedures   CBC with Differential ([facility] Only)    Standing Status:   Future    Standing Expiration Date:   11/15/2023   The patient has a good understanding of the overall plan. he agrees with it. he will call with any problems that may develop before the next visit here. Total time spent: 30 mins including face to face time and time spent for planning, charting and co-ordination of care   Tamsen Meek, MD 11/14/22    I Janan Ridge am scribing for Dr. Pamelia Hoit  I have reviewed the above documentation for accuracy and completeness, and I agree with the above.

## 2022-11-14 ENCOUNTER — Inpatient Hospital Stay: Payer: BC Managed Care – PPO | Attending: Hematology and Oncology

## 2022-11-14 ENCOUNTER — Inpatient Hospital Stay (HOSPITAL_BASED_OUTPATIENT_CLINIC_OR_DEPARTMENT_OTHER): Payer: BC Managed Care – PPO | Admitting: Hematology and Oncology

## 2022-11-14 ENCOUNTER — Inpatient Hospital Stay: Payer: BC Managed Care – PPO

## 2022-11-14 VITALS — BP 117/72 | HR 63 | Temp 97.8°F | Resp 18 | Ht 69.0 in | Wt 255.4 lb

## 2022-11-14 DIAGNOSIS — D751 Secondary polycythemia: Secondary | ICD-10-CM | POA: Diagnosis present

## 2022-11-14 LAB — CBC WITH DIFFERENTIAL (CANCER CENTER ONLY)
Abs Immature Granulocytes: 0.03 10*3/uL (ref 0.00–0.07)
Basophils Absolute: 0.1 10*3/uL (ref 0.0–0.1)
Basophils Relative: 1 %
Eosinophils Absolute: 0.3 10*3/uL (ref 0.0–0.5)
Eosinophils Relative: 4 %
HCT: 53 % — ABNORMAL HIGH (ref 39.0–52.0)
Hemoglobin: 17.1 g/dL — ABNORMAL HIGH (ref 13.0–17.0)
Immature Granulocytes: 0 %
Lymphocytes Relative: 32 %
Lymphs Abs: 2.6 10*3/uL (ref 0.7–4.0)
MCH: 25.8 pg — ABNORMAL LOW (ref 26.0–34.0)
MCHC: 32.3 g/dL (ref 30.0–36.0)
MCV: 80.1 fL (ref 80.0–100.0)
Monocytes Absolute: 0.9 10*3/uL (ref 0.1–1.0)
Monocytes Relative: 11 %
Neutro Abs: 4.3 10*3/uL (ref 1.7–7.7)
Neutrophils Relative %: 52 %
Platelet Count: 289 10*3/uL (ref 150–400)
RBC: 6.62 MIL/uL — ABNORMAL HIGH (ref 4.22–5.81)
RDW: 17.2 % — ABNORMAL HIGH (ref 11.5–15.5)
WBC Count: 8.3 10*3/uL (ref 4.0–10.5)
nRBC: 0 % (ref 0.0–0.2)

## 2022-11-14 NOTE — Assessment & Plan Note (Addendum)
Lab review: 11/25/2019: Hemoglobin 16.5 11/26/2020: Hemoglobin 17.6 02/11/2021: Hemoglobin 15.9, hematocrit 47 02/17/2022: Hemoglobin 18.3 with hematocrit 53.5 02/28/2022: Hemoglobin 19.5, hematocrit 57.5 (3/25: given 1 unit) 06/02/2022: Hemoglobin 16.9 (improved after stopping testosterone) 11/14/2022: Hemoglobin 17.1, hematocrit 53   MPN panel: Negative for relevant mutations like JAK2, VUS (TET2) Erythropoietin: 21  06/19/22: U/S abd: 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  Unchanged from before.  Seeing urology  Was taking Neugenix (testosterone): He now stopped it.     Plan: Phlebotomy for hematocrit greater than 55 He does not need phlebotomy today.  Return to clinic in 6 months with labs and phlebotomy if needed.

## 2023-05-15 ENCOUNTER — Telehealth: Payer: Self-pay | Admitting: Hematology and Oncology

## 2023-05-15 NOTE — Telephone Encounter (Signed)
Rescheduled appointments per patient via left voicemail. Patient is aware of the changes made to his upcoming appointments.

## 2023-05-18 ENCOUNTER — Ambulatory Visit: Payer: BC Managed Care – PPO | Admitting: Hematology and Oncology

## 2023-05-18 ENCOUNTER — Other Ambulatory Visit: Payer: BC Managed Care – PPO

## 2023-06-07 NOTE — Progress Notes (Signed)
Patient Care Team: [name], MD as PCP - General (Family Medicine)  DIAGNOSIS: No diagnosis found.  SUMMARY OF ONCOLOGIC HISTORY: Oncology History   No history exists.    CHIEF COMPLIANT: Follow-up phlebotomy 67 y.o. male is here because of recent diagnosis of Polycythemia He presents to the clinic today for follow-up.    INTERVAL HISTORY: [name] is a    ALLERGIES:  is allergic to fenofibrate, lisinopril, metformin, and other.  MEDICATIONS:  Current Outpatient Medications  Medication Sig Dispense Refill   allopurinol (ZYLOPRIM) 300 MG tablet Take 150-300 mg by mouth daily before breakfast.      amoxicillin (AMOXIL) 875 MG tablet Take 875 mg by mouth 2 (two) times daily.     atenolol-chlorthalidone (TENORETIC) 100-25 MG tablet Take 0.5 tablets by mouth daily.     cetirizine (ZYRTEC) 10 MG tablet Take 10 mg by mouth daily.     Continuous Blood Gluc Sensor (DEXCOM G6 SENSOR) MISC SMARTSIG:1 Each Topical Every 10 Days     dexlansoprazole (DEXILANT) 60 MG capsule Take 60 mg by mouth daily.     esomeprazole (NEXIUM) 40 MG capsule Take 40 mg by mouth daily.     insulin aspart (NOVOLOG FLEXPEN) 100 UNIT/ML FlexPen Inject 2-10 Units into the skin 3 (three) times daily with meals.     Insulin Glargine (BASAGLAR KWIKPEN) 100 UNIT/ML SOPN Inject 30 Units into the skin daily.     lipase/protease/amylase (CREON) 12000-38000 units CPEP capsule Take by mouth.     losartan (COZAAR) 25 MG tablet Take 12.5 mg by mouth daily.     meloxicam (MOBIC) 15 MG tablet Take 15 mg by mouth daily.     Multiple Vitamin (MULTIVITAMIN WITH MINERALS) TABS tablet Take 1 tablet by mouth daily.     Omega-3 Fatty Acids (FISH OIL PO) Take by mouth.     polyethylene glycol (MIRALAX) 17 g packet Take 17 g by mouth daily. 14 each 0   rosuvastatin (CRESTOR) 5 MG tablet Take 5 mg by mouth every 3 (three) days.     No current facility-administered medications for this visit.    PHYSICAL  EXAMINATION: ECOG PERFORMANCE STATUS: {CHL ONC ECOG PS:[id number]}  There were no vitals filed for this visit. There were no vitals filed for this visit.  BREAST:*** No palpable masses or nodules in either right or left breasts. No palpable axillary supraclavicular or infraclavicular adenopathy no breast tenderness or nipple discharge. (exam performed in the presence of a chaperone)  LABORATORY DATA:  I have reviewed the data as listed    Latest Ref Rng & Units 11/26/2020    1:19 PM 11/26/2019   12:23 AM 11/25/2019    2:10 AM  CMP  Glucose 70 - 99 mg/dL 409  811  96   BUN 8 - 23 mg/dL 20  35  37   Creatinine 0.61 - 1.24 mg/dL 9.14  7.82  9.56   Sodium 135 - 145 mmol/L 132  137  136   Potassium 3.5 - 5.1 mmol/L 4.3  3.8  3.1   Chloride 98 - 111 mmol/L 95  99  95   CO2 22 - 32 mmol/L 27  26  27    Calcium 8.9 - 10.3 mg/dL 9.8  9.1  8.8   Total Protein 6.5 - 8.1 g/dL 7.9  7.3  7.5   Total Bilirubin 0.3 - 1.2 mg/dL 0.8  1.3  1.3   Alkaline Phos 38 - 126 U/L 133  80  89  AST 15 - 41 U/L 24  35  52   ALT 0 - 44 U/L 39  55  61     Lab Results  Component Value Date   WBC 8.3 11/14/2022   HGB 17.1 (H) 11/14/2022   HCT 53.0 (H) 11/14/2022   MCV 80.1 11/14/2022   PLT 289 11/14/2022   NEUTROABS 4.3 11/14/2022    ASSESSMENT & PLAN:  No problem-specific Assessment & Plan notes found for this encounter.    No orders of the defined types were placed in this encounter.  The patient has a good understanding of the overall plan. he agrees with it. he will call with any problems that may develop before the next visit here. Total time spent: 30 mins including face to face time and time spent for planning, charting and co-ordination of care   Sherlyn Lick, CMA 06/07/23    I Janan Ridge am acting as a Neurosurgeon for [employer]  ***

## 2023-06-09 ENCOUNTER — Inpatient Hospital Stay: Payer: BC Managed Care – PPO | Attending: Hematology and Oncology

## 2023-06-09 ENCOUNTER — Inpatient Hospital Stay (HOSPITAL_BASED_OUTPATIENT_CLINIC_OR_DEPARTMENT_OTHER): Payer: BC Managed Care – PPO | Admitting: Hematology and Oncology

## 2023-06-09 ENCOUNTER — Other Ambulatory Visit: Payer: Self-pay

## 2023-06-09 VITALS — BP 133/90 | HR 59 | Temp 98.2°F | Resp 18 | Ht 69.0 in | Wt 262.7 lb

## 2023-06-09 DIAGNOSIS — D751 Secondary polycythemia: Secondary | ICD-10-CM | POA: Insufficient documentation

## 2023-06-09 LAB — CBC WITH DIFFERENTIAL (CANCER CENTER ONLY)
Abs Immature Granulocytes: 0.03 10*3/uL (ref 0.00–0.07)
Basophils Absolute: 0 10*3/uL (ref 0.0–0.1)
Basophils Relative: 1 %
Eosinophils Absolute: 0.4 10*3/uL (ref 0.0–0.5)
Eosinophils Relative: 5 %
HCT: 53.7 % — ABNORMAL HIGH (ref 39.0–52.0)
Hemoglobin: 17.6 g/dL — ABNORMAL HIGH (ref 13.0–17.0)
Immature Granulocytes: 0 %
Lymphocytes Relative: 29 %
Lymphs Abs: 2.3 10*3/uL (ref 0.7–4.0)
MCH: 27.7 pg (ref 26.0–34.0)
MCHC: 32.8 g/dL (ref 30.0–36.0)
MCV: 84.4 fL (ref 80.0–100.0)
Monocytes Absolute: 0.8 10*3/uL (ref 0.1–1.0)
Monocytes Relative: 10 %
Neutro Abs: 4.5 10*3/uL (ref 1.7–7.7)
Neutrophils Relative %: 55 %
Platelet Count: 244 10*3/uL (ref 150–400)
RBC: 6.36 MIL/uL — ABNORMAL HIGH (ref 4.22–5.81)
RDW: 14.4 % (ref 11.5–15.5)
WBC Count: 8.1 10*3/uL (ref 4.0–10.5)
nRBC: 0 % (ref 0.0–0.2)

## 2023-06-09 NOTE — Assessment & Plan Note (Addendum)
Lab review: 11/25/2019: Hemoglobin 16.5 11/26/2020: Hemoglobin 17.6 02/11/2021: Hemoglobin 15.9, hematocrit 47 02/17/2022: Hemoglobin 18.3 with hematocrit 53.5 02/28/2022: Hemoglobin 19.5, hematocrit 57.5 (3/25: given 1 unit) 06/02/2022: Hemoglobin 16.9 (improved after stopping testosterone), sickle cell trait 11/14/2022: Hemoglobin 17.1, hematocrit 53   MPN panel: Negative for relevant mutations like JAK2, VUS (TET2) Erythropoietin: 21   06/19/22: U/S abd: 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  Unchanged from before.  Seeing urology   Was taking Neugenix (testosterone): He now stopped it. [facility] stopped accepting his blood because he has sickle cell trait.  Also because his blood was too thick.   Plan: Phlebotomy for hematocrit greater than 50 We will set up phlebotomy sometime this week. He will come every 3 months with lab and phlebotomy and I will see him back in 6 months

## 2023-06-12 ENCOUNTER — Telehealth: Payer: Self-pay | Admitting: Hematology and Oncology

## 2023-06-12 NOTE — Telephone Encounter (Signed)
Scheduled appointments per 6/25 los. Left voicemail for patient.

## 2023-06-15 ENCOUNTER — Other Ambulatory Visit: Payer: Self-pay

## 2023-06-15 DIAGNOSIS — D751 Secondary polycythemia: Secondary | ICD-10-CM

## 2023-06-16 ENCOUNTER — Inpatient Hospital Stay: Payer: BC Managed Care – PPO | Attending: Hematology and Oncology

## 2023-06-16 ENCOUNTER — Other Ambulatory Visit: Payer: Self-pay

## 2023-06-16 ENCOUNTER — Other Ambulatory Visit: Payer: Self-pay | Admitting: Hematology and Oncology

## 2023-06-16 ENCOUNTER — Inpatient Hospital Stay: Payer: BC Managed Care – PPO

## 2023-06-16 VITALS — BP 142/76 | HR 54 | Temp 97.5°F | Resp 19

## 2023-06-16 DIAGNOSIS — D751 Secondary polycythemia: Secondary | ICD-10-CM | POA: Diagnosis present

## 2023-06-16 LAB — CBC WITH DIFFERENTIAL (CANCER CENTER ONLY)
Abs Immature Granulocytes: 0.03 10*3/uL (ref 0.00–0.07)
Basophils Absolute: 0.1 10*3/uL (ref 0.0–0.1)
Basophils Relative: 1 %
Eosinophils Absolute: 0.4 10*3/uL (ref 0.0–0.5)
Eosinophils Relative: 5 %
HCT: 57.1 % — ABNORMAL HIGH (ref 39.0–52.0)
Hemoglobin: 18.4 g/dL — ABNORMAL HIGH (ref 13.0–17.0)
Immature Granulocytes: 0 %
Lymphocytes Relative: 32 %
Lymphs Abs: 2.7 10*3/uL (ref 0.7–4.0)
MCH: 27.4 pg (ref 26.0–34.0)
MCHC: 32.2 g/dL (ref 30.0–36.0)
MCV: 85 fL (ref 80.0–100.0)
Monocytes Absolute: 0.8 10*3/uL (ref 0.1–1.0)
Monocytes Relative: 10 %
Neutro Abs: 4.6 10*3/uL (ref 1.7–7.7)
Neutrophils Relative %: 52 %
Platelet Count: 278 10*3/uL (ref 150–400)
RBC: 6.72 MIL/uL — ABNORMAL HIGH (ref 4.22–5.81)
RDW: 15.5 % (ref 11.5–15.5)
WBC Count: 8.6 10*3/uL (ref 4.0–10.5)
nRBC: 0 % (ref 0.0–0.2)

## 2023-06-16 NOTE — Patient Instructions (Signed)

## 2023-06-16 NOTE — Progress Notes (Signed)
Therapeutic phlebotomy orders placed on behalf of Dr Pamelia Hoit.

## 2023-06-16 NOTE — Progress Notes (Signed)
Therapeutic phlebotomy performed per MD orders. 18G needle utilized on Left upper arm starting at 1555 and ending at 1613 with 508 grams removed. Pt provided with fluids and food. Monitored for about 15 minutes with no complaints. Pt left infusion room without vitals being taken.

## 2023-09-04 ENCOUNTER — Other Ambulatory Visit: Payer: Self-pay | Admitting: *Deleted

## 2023-09-04 DIAGNOSIS — D751 Secondary polycythemia: Secondary | ICD-10-CM

## 2023-09-08 ENCOUNTER — Inpatient Hospital Stay: Payer: BC Managed Care – PPO

## 2023-09-08 ENCOUNTER — Inpatient Hospital Stay: Payer: BC Managed Care – PPO | Attending: Hematology and Oncology

## 2023-09-08 VITALS — BP 106/75 | HR 66 | Temp 98.0°F | Resp 18

## 2023-09-08 DIAGNOSIS — D751 Secondary polycythemia: Secondary | ICD-10-CM | POA: Diagnosis present

## 2023-09-08 LAB — CBC WITH DIFFERENTIAL (CANCER CENTER ONLY)
Abs Immature Granulocytes: 0.02 10*3/uL (ref 0.00–0.07)
Basophils Absolute: 0.1 10*3/uL (ref 0.0–0.1)
Basophils Relative: 1 %
Eosinophils Absolute: 0.5 10*3/uL (ref 0.0–0.5)
Eosinophils Relative: 6 %
HCT: 51.1 % (ref 39.0–52.0)
Hemoglobin: 16.8 g/dL (ref 13.0–17.0)
Immature Granulocytes: 0 %
Lymphocytes Relative: 31 %
Lymphs Abs: 2.4 10*3/uL (ref 0.7–4.0)
MCH: 27.3 pg (ref 26.0–34.0)
MCHC: 32.9 g/dL (ref 30.0–36.0)
MCV: 83.1 fL (ref 80.0–100.0)
Monocytes Absolute: 0.8 10*3/uL (ref 0.1–1.0)
Monocytes Relative: 11 %
Neutro Abs: 4 10*3/uL (ref 1.7–7.7)
Neutrophils Relative %: 51 %
Platelet Count: 274 10*3/uL (ref 150–400)
RBC: 6.15 MIL/uL — ABNORMAL HIGH (ref 4.22–5.81)
RDW: 16.1 % — ABNORMAL HIGH (ref 11.5–15.5)
WBC Count: 7.8 10*3/uL (ref 4.0–10.5)
nRBC: 0 % (ref 0.0–0.2)

## 2023-09-08 NOTE — Patient Instructions (Signed)

## 2023-09-08 NOTE — Progress Notes (Signed)
[name] presents today for phlebotomy per MD orders. 16G phlebotomy kit used in RAC. Phlebotomy procedure started at 1542 and ended at 1552. 501 grams removed. Patient observed for 10 minutes after procedure without any incident, declined full 30 minute observation. Patient tolerated procedure well. IV needle removed intact.

## 2023-10-29 ENCOUNTER — Telehealth: Payer: Self-pay | Admitting: Hematology and Oncology

## 2023-10-29 NOTE — Telephone Encounter (Signed)
 Left patient a vm regarding upcoming appointment

## 2023-11-24 ENCOUNTER — Encounter: Payer: Self-pay | Admitting: Hematology and Oncology

## 2023-11-30 ENCOUNTER — Other Ambulatory Visit: Payer: Self-pay | Admitting: *Deleted

## 2023-11-30 DIAGNOSIS — D751 Secondary polycythemia: Secondary | ICD-10-CM

## 2023-12-01 ENCOUNTER — Inpatient Hospital Stay: Payer: Medicare PPO | Attending: Hematology and Oncology

## 2023-12-01 ENCOUNTER — Inpatient Hospital Stay: Payer: Medicare PPO

## 2023-12-01 ENCOUNTER — Inpatient Hospital Stay: Payer: Medicare PPO | Admitting: Hematology and Oncology

## 2023-12-01 ENCOUNTER — Inpatient Hospital Stay: Payer: BC Managed Care – PPO

## 2023-12-01 ENCOUNTER — Inpatient Hospital Stay (HOSPITAL_BASED_OUTPATIENT_CLINIC_OR_DEPARTMENT_OTHER): Payer: Medicare PPO | Admitting: Hematology and Oncology

## 2023-12-01 VITALS — BP 140/64 | HR 65 | Temp 98.6°F | Resp 18 | Ht 69.0 in | Wt 255.1 lb

## 2023-12-01 VITALS — BP 114/80 | HR 56 | Resp 18

## 2023-12-01 DIAGNOSIS — D751 Secondary polycythemia: Secondary | ICD-10-CM

## 2023-12-01 DIAGNOSIS — Z79899 Other long term (current) drug therapy: Secondary | ICD-10-CM | POA: Insufficient documentation

## 2023-12-01 DIAGNOSIS — E119 Type 2 diabetes mellitus without complications: Secondary | ICD-10-CM | POA: Diagnosis not present

## 2023-12-01 DIAGNOSIS — Z794 Long term (current) use of insulin: Secondary | ICD-10-CM | POA: Insufficient documentation

## 2023-12-01 LAB — CBC WITH DIFFERENTIAL (CANCER CENTER ONLY)
Abs Immature Granulocytes: 0.02 10*3/uL (ref 0.00–0.07)
Basophils Absolute: 0.1 10*3/uL (ref 0.0–0.1)
Basophils Relative: 1 %
Eosinophils Absolute: 0.4 10*3/uL (ref 0.0–0.5)
Eosinophils Relative: 5 %
HCT: 50.3 % (ref 39.0–52.0)
Hemoglobin: 15.9 g/dL (ref 13.0–17.0)
Immature Granulocytes: 0 %
Lymphocytes Relative: 34 %
Lymphs Abs: 2.4 10*3/uL (ref 0.7–4.0)
MCH: 25.5 pg — ABNORMAL LOW (ref 26.0–34.0)
MCHC: 31.6 g/dL (ref 30.0–36.0)
MCV: 80.7 fL (ref 80.0–100.0)
Monocytes Absolute: 0.8 10*3/uL (ref 0.1–1.0)
Monocytes Relative: 11 %
Neutro Abs: 3.6 10*3/uL (ref 1.7–7.7)
Neutrophils Relative %: 49 %
Platelet Count: 301 10*3/uL (ref 150–400)
RBC: 6.23 MIL/uL — ABNORMAL HIGH (ref 4.22–5.81)
RDW: 15.6 % — ABNORMAL HIGH (ref 11.5–15.5)
WBC Count: 7.2 10*3/uL (ref 4.0–10.5)
nRBC: 0 % (ref 0.0–0.2)

## 2023-12-01 MED ORDER — INSULIN GLARGINE 100 UNIT/ML ~~LOC~~ SOLN: 20.0000 [IU] | Freq: Every day | SUBCUTANEOUS | Status: AC

## 2023-12-01 NOTE — Assessment & Plan Note (Signed)
Lab review: 11/25/2019: Hemoglobin 16.5 11/26/2020: Hemoglobin 17.6 02/11/2021: Hemoglobin 15.9, hematocrit 47 02/17/2022: Hemoglobin 18.3 with hematocrit 53.5 02/28/2022: Hemoglobin 19.5, hematocrit 57.5 (3/25: given 1 unit) 06/02/2022: Hemoglobin 16.9 (improved after stopping testosterone), sickle cell trait 11/14/2022: Hemoglobin 17.1, hematocrit 53 06/09/2023: Hemoglobin 17.6, hematocrit 53.7   MPN panel: Negative for relevant mutations like JAK2, VUS (TET2) Erythropoietin: 21   06/19/22: U/S abd: 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  Unchanged from before.  Seeing urology   Was taking Neugenix (testosterone): He now stopped it. [facility] has stopped accepting his blood because he has sickle cell trait.  Also because his blood was too thick.   Plan: Phlebotomy for hematocrit greater than 50 (every 3 months) He will come every 3 months with lab and phlebotomy and I will see him back in 6 months

## 2023-12-01 NOTE — Progress Notes (Signed)
Patient Care Team: [name], MD as PCP - General (Family Medicine)  DIAGNOSIS:  Encounter Diagnosis  Name Primary?   Polycythemia, secondary Yes      CHIEF COMPLIANT: Follow-up of secondary polycythemia  HISTORY OF PRESENT ILLNESS:  History of Present Illness   The patient, with a history of polycythemia, presents for a routine phlebotomy, which is performed every three months. She reports feeling 'pretty good' and does not notice any change in her symptoms after the procedure. She recently celebrated a significant birthday and spent the day with her grandchildren.  In addition to polycythemia, the patient has diabetes and recently switched from Basaglar to Lantus, taking 20 units daily. She reports no other changes to her medication regimen, which includes several supplements.         ALLERGIES:  is allergic to amoxicill-clarithro-lansopraz, fenofibrate, lisinopril, metformin, and other.  MEDICATIONS:  Current Outpatient Medications  Medication Sig Dispense Refill   insulin glargine (LANTUS) 100 UNIT/ML injection Inject 0.2 mLs (20 Units total) into the skin daily.     allopurinol (ZYLOPRIM) 300 MG tablet Take 150-300 mg by mouth daily before breakfast.      atenolol-chlorthalidone (TENORETIC) 100-25 MG tablet Take 0.5 tablets by mouth daily.     cetirizine (ZYRTEC) 10 MG tablet Take 10 mg by mouth daily.     Continuous Blood Gluc Sensor (DEXCOM G6 SENSOR) MISC SMARTSIG:1 Each Topical Every 10 Days     dexlansoprazole (DEXILANT) 60 MG capsule Take 60 mg by mouth daily.     esomeprazole (NEXIUM) 40 MG capsule Take 40 mg by mouth daily.     insulin aspart (NOVOLOG FLEXPEN) 100 UNIT/ML FlexPen Inject 2-10 Units into the skin 3 (three) times daily with meals.     lipase/protease/amylase (CREON) 12000-38000 units CPEP capsule Take by mouth.     losartan (COZAAR) 25 MG tablet Take 12.5 mg by mouth daily.     meloxicam (MOBIC) 15 MG tablet Take 15 mg by mouth daily.      Multiple Vitamin (MULTIVITAMIN WITH MINERALS) TABS tablet Take 1 tablet by mouth daily.     Omega-3 Fatty Acids (FISH OIL PO) Take by mouth.     polyethylene glycol (MIRALAX) 17 g packet Take 17 g by mouth daily. 14 each 0   rosuvastatin (CRESTOR) 5 MG tablet Take 5 mg by mouth every 3 (three) days.     No current facility-administered medications for this visit.    PHYSICAL EXAMINATION: ECOG PERFORMANCE STATUS: 1 - Symptomatic but completely ambulatory  Vitals:   12/01/23 0820  BP: (!) 140/64  Pulse: 65  Resp: 18  Temp: 98.6 F (37 C)  SpO2: 98%   Filed Weights   12/01/23 0820  Weight: 255 lb 1.6 oz (115.7 kg)      LABORATORY DATA:  I have reviewed the data as listed    Latest Ref Rng & Units 11/26/2020    1:19 PM 11/26/2019   12:23 AM 11/25/2019    2:10 AM  CMP  Glucose 70 - 99 mg/dL 161  096  96   BUN 8 - 23 mg/dL 20  35  37   Creatinine 0.61 - 1.24 mg/dL 0.45  4.09  8.11   Sodium 135 - 145 mmol/L 132  137  136   Potassium 3.5 - 5.1 mmol/L 4.3  3.8  3.1   Chloride 98 - 111 mmol/L 95  99  95   CO2 22 - 32 mmol/L 27  26  27    Calcium  8.9 - 10.3 mg/dL 9.8  9.1  8.8   Total Protein 6.5 - 8.1 g/dL 7.9  7.3  7.5   Total Bilirubin 0.3 - 1.2 mg/dL 0.8  1.3  1.3   Alkaline Phos 38 - 126 U/L 133  80  89   AST 15 - 41 U/L 24  35  52   ALT 0 - 44 U/L 39  55  61     Lab Results  Component Value Date   WBC 7.2 12/01/2023   HGB 15.9 12/01/2023   HCT 50.3 12/01/2023   MCV 80.7 12/01/2023   PLT 301 12/01/2023   NEUTROABS 3.6 12/01/2023    ASSESSMENT & PLAN:  Polycythemia, secondary Lab review: 11/25/2019: Hemoglobin 16.5 11/26/2020: Hemoglobin 17.6 02/11/2021: Hemoglobin 15.9, hematocrit 47 02/17/2022: Hemoglobin 18.3 with hematocrit 53.5 02/28/2022: Hemoglobin 19.5, hematocrit 57.5 (3/25: given 1 unit) 06/02/2022: Hemoglobin 16.9 (improved after stopping testosterone), sickle cell trait 11/14/2022: Hemoglobin 17.1, hematocrit 53 06/09/2023: Hemoglobin 17.6, hematocrit  53.7 12/01/2023: Hemoglobin 15.9, hematocrit 50.3   MPN panel: Negative for relevant mutations like JAK2, VUS (TET2) Erythropoietin: 21   06/19/22: U/S abd: 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  Unchanged from before.  Seeing urology   Was taking Neugenix (testosterone): He now stopped it. [facility] has stopped accepting his blood because he has sickle cell trait.  Also because his blood was too thick.   Plan: Phlebotomy for hematocrit greater than 50 (every 3 months) He will come every 3 months with lab and phlebotomy and I will see him back in 6 months     No orders of the defined types were placed in this encounter.  The patient has a good understanding of the overall plan. he agrees with it. he will call with any problems that may develop before the next visit here. Total time spent: 30 mins including face to face time and time spent for planning, charting and co-ordination of care   Tamsen Meek, MD 12/01/23

## 2023-12-01 NOTE — Progress Notes (Signed)
[name] presents today for phlebotomy per MD orders. Phlebotomy procedure started at 947 and ended at 958. 509 grams removed using a 16g phlebotomy kit to the left [facility] Patient observed for 100 minutes after procedure without any incident. Patient tolerated procedure well. IV needle removed intact.

## 2023-12-01 NOTE — Patient Instructions (Signed)

## 2023-12-14 ENCOUNTER — Encounter: Payer: Self-pay | Admitting: Hematology and Oncology

## 2024-01-01 DIAGNOSIS — E1165 Type 2 diabetes mellitus with hyperglycemia: Secondary | ICD-10-CM | POA: Diagnosis not present

## 2024-01-20 DIAGNOSIS — K080 Exfoliation of teeth due to systemic causes: Secondary | ICD-10-CM | POA: Diagnosis not present

## 2024-02-01 DIAGNOSIS — E1165 Type 2 diabetes mellitus with hyperglycemia: Secondary | ICD-10-CM | POA: Diagnosis not present

## 2024-02-22 ENCOUNTER — Encounter: Payer: Self-pay | Admitting: Hematology and Oncology

## 2024-02-29 ENCOUNTER — Inpatient Hospital Stay: Payer: BC Managed Care – PPO

## 2024-02-29 ENCOUNTER — Encounter: Payer: Self-pay | Admitting: Hematology and Oncology

## 2024-02-29 ENCOUNTER — Inpatient Hospital Stay: Payer: BC Managed Care – PPO | Attending: Hematology and Oncology

## 2024-02-29 DIAGNOSIS — D751 Secondary polycythemia: Secondary | ICD-10-CM

## 2024-02-29 LAB — CBC WITH DIFFERENTIAL (CANCER CENTER ONLY)
Abs Immature Granulocytes: 0.03 10*3/uL (ref 0.00–0.07)
Basophils Absolute: 0.1 10*3/uL (ref 0.0–0.1)
Basophils Relative: 1 %
Eosinophils Absolute: 0.3 10*3/uL (ref 0.0–0.5)
Eosinophils Relative: 4 %
HCT: 49.8 % (ref 39.0–52.0)
Hemoglobin: 15.8 g/dL (ref 13.0–17.0)
Immature Granulocytes: 0 %
Lymphocytes Relative: 30 %
Lymphs Abs: 2.4 10*3/uL (ref 0.7–4.0)
MCH: 24.1 pg — ABNORMAL LOW (ref 26.0–34.0)
MCHC: 31.7 g/dL (ref 30.0–36.0)
MCV: 76 fL — ABNORMAL LOW (ref 80.0–100.0)
Monocytes Absolute: 0.9 10*3/uL (ref 0.1–1.0)
Monocytes Relative: 11 %
Neutro Abs: 4.3 10*3/uL (ref 1.7–7.7)
Neutrophils Relative %: 54 %
Platelet Count: 308 10*3/uL (ref 150–400)
RBC: 6.55 MIL/uL — ABNORMAL HIGH (ref 4.22–5.81)
RDW: 17.7 % — ABNORMAL HIGH (ref 11.5–15.5)
WBC Count: 7.9 10*3/uL (ref 4.0–10.5)
nRBC: 0 % (ref 0.0–0.2)

## 2024-02-29 NOTE — Progress Notes (Signed)
 Phlebotomy not required at this time per Dr. Pamelia Hoit due to Hct 49.8.  Per MD, RN advised patient to contact [facility] before next appointment if he becomes symptomatic.

## 2024-02-29 NOTE — Progress Notes (Signed)
 Pt's hgb today is 15.8 and hct is 49.8. Per MD, pt will not need therapeutic phlebotomy today. Myles Gip, RN to educate pt if he becomes symptomatic before his next lab appt to call us.

## 2024-03-09 DIAGNOSIS — E1165 Type 2 diabetes mellitus with hyperglycemia: Secondary | ICD-10-CM | POA: Diagnosis not present

## 2024-03-09 DIAGNOSIS — E291 Testicular hypofunction: Secondary | ICD-10-CM | POA: Diagnosis not present

## 2024-03-16 DIAGNOSIS — E1165 Type 2 diabetes mellitus with hyperglycemia: Secondary | ICD-10-CM | POA: Diagnosis not present

## 2024-03-16 DIAGNOSIS — E785 Hyperlipidemia, unspecified: Secondary | ICD-10-CM | POA: Diagnosis not present

## 2024-03-16 DIAGNOSIS — N1831 Chronic kidney disease, stage 3a: Secondary | ICD-10-CM | POA: Diagnosis not present

## 2024-03-16 DIAGNOSIS — E291 Testicular hypofunction: Secondary | ICD-10-CM | POA: Diagnosis not present

## 2024-03-28 DIAGNOSIS — E1165 Type 2 diabetes mellitus with hyperglycemia: Secondary | ICD-10-CM | POA: Diagnosis not present

## 2024-04-05 DIAGNOSIS — G479 Sleep disorder, unspecified: Secondary | ICD-10-CM | POA: Diagnosis not present

## 2024-04-05 DIAGNOSIS — M1990 Unspecified osteoarthritis, unspecified site: Secondary | ICD-10-CM | POA: Diagnosis not present

## 2024-04-26 DIAGNOSIS — M25561 Pain in right knee: Secondary | ICD-10-CM | POA: Diagnosis not present

## 2024-04-26 DIAGNOSIS — M1711 Unilateral primary osteoarthritis, right knee: Secondary | ICD-10-CM | POA: Diagnosis not present

## 2024-04-26 DIAGNOSIS — M11262 Other chondrocalcinosis, left knee: Secondary | ICD-10-CM | POA: Diagnosis not present

## 2024-04-26 DIAGNOSIS — M11261 Other chondrocalcinosis, right knee: Secondary | ICD-10-CM | POA: Diagnosis not present

## 2024-04-26 DIAGNOSIS — M25562 Pain in left knee: Secondary | ICD-10-CM | POA: Diagnosis not present

## 2024-04-27 DIAGNOSIS — E1165 Type 2 diabetes mellitus with hyperglycemia: Secondary | ICD-10-CM | POA: Diagnosis not present

## 2024-05-11 DIAGNOSIS — E119 Type 2 diabetes mellitus without complications: Secondary | ICD-10-CM | POA: Diagnosis not present

## 2024-05-11 DIAGNOSIS — H10231 Serous conjunctivitis, except viral, right eye: Secondary | ICD-10-CM | POA: Diagnosis not present

## 2024-05-16 DIAGNOSIS — M25562 Pain in left knee: Secondary | ICD-10-CM | POA: Diagnosis not present

## 2024-05-16 DIAGNOSIS — M25561 Pain in right knee: Secondary | ICD-10-CM | POA: Diagnosis not present

## 2024-05-28 DIAGNOSIS — E1165 Type 2 diabetes mellitus with hyperglycemia: Secondary | ICD-10-CM | POA: Diagnosis not present

## 2024-05-29 ENCOUNTER — Other Ambulatory Visit: Payer: Self-pay | Admitting: Nurse Practitioner

## 2024-05-29 DIAGNOSIS — D751 Secondary polycythemia: Secondary | ICD-10-CM

## 2024-05-29 NOTE — Progress Notes (Unsigned)
 Patient Care Team: Olin Bertin, MD as PCP - General (Family Medicine)  Clinic Day:  05/30/2024  Referring physician: Olin Bertin, MD  ASSESSMENT & PLAN:   Assessment & Plan: Polycythemia, secondary  Lab review: 11/25/2019: Hemoglobin 16.5 11/26/2020: Hemoglobin 17.6 02/11/2021: Hemoglobin 15.9, hematocrit 47 02/17/2022: Hemoglobin 18.3 with hematocrit 53.5 02/28/2022: Hemoglobin 19.5, hematocrit 57.5 (3/25: given 1 unit) 06/02/2022: Hemoglobin 16.9 (improved after stopping testosterone), sickle cell trait 11/14/2022: Hemoglobin 17.1, hematocrit 53 06/09/2023: Hemoglobin 17.6, hematocrit 53.7 12/01/2023: Hemoglobin 15.9, hematocrit 50.3 05/30/2024 - Hemoglobin 17.1 and hematocrit 51.3 - phlebotomy treatment provided    MPN panel: Negative for relevant mutations like JAK2, VUS (TET2) Erythropoietin : 21   06/19/22: U/S abd: 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  Unchanged from before.  Seeing urology   Was taking Neugenix (testosterone): He now stopped it. [facility] has stopped accepting his blood because he has sickle cell trait.  Also because his blood was too thick.   Plan: Phlebotomy for hematocrit greater than 50 (every 3 months) He will come every 3 months with lab and phlebotomy and I will see him back in 6 months    Plan: Labs reviewed.  -Hgb 17.1 and Hct 51.3. Meets criteria to have phlebotomy today.  Repeat labs in 3 months and phlebotomy treatment at that time if needed. Labs, follow up, and phlebotomy treatment in 6 months, sooner if needed   The patient understands the plans discussed today and is in agreement with them.  He knows to contact our office if he develops concerns prior to his next appointment.  I provided 20 minutes of face-to-face time during this encounter and > 50% was spent counseling as documented under my assessment and plan.    Sharyon Deis, NP  [facility] - A DEPT OF [facility]. [facility] [address] [place] [address] Dept: [phone] Dept Fax: [phone]   No orders of the defined types were placed in this encounter.     CHIEF COMPLAINT:  CC: Secondary polycythemia  Current Treatment: Phlebotomy for hematocrit >50 (every 3 months)  INTERVAL HISTORY:  [name] is here today for repeat clinical assessment.  He last saw Dr. Gudena on 12/01/2023.  Patient reports feeling well today, without concerns or complaints. States that he tolerates phlebotomy treatments well. He denies chest pain, chest pressure, or shortness of breath. He denies headaches or visual disturbances. He denies abdominal pain, nausea, vomiting, or changes in bowel or bladder habits.  He denies fevers or chills. He denies pain. His appetite is good. His weight has decreased 5 pounds over last 6 months.  I have reviewed the past medical history, past surgical history, social history and family history with the patient and they are unchanged from previous note.  ALLERGIES:  is allergic to amoxicill-clarithro-lansopraz, fenofibrate, lisinopril, metformin, and other.  MEDICATIONS:  Current Outpatient Medications  Medication Sig Dispense Refill   allopurinol  (ZYLOPRIM ) 300 MG tablet Take 150-300 mg by mouth daily before breakfast.      atenolol -chlorthalidone  (TENORETIC ) 100-25 MG tablet Take 0.5 tablets by mouth daily.     cetirizine (ZYRTEC) 10 MG tablet Take 10 mg by mouth daily.     esomeprazole (NEXIUM) 40 MG capsule Take 40 mg by mouth daily.     insulin  aspart (NOVOLOG  FLEXPEN) 100 UNIT/ML FlexPen Inject 2-10 Units into the skin 3 (three) times daily with meals.     insulin  glargine (LANTUS ) 100 UNIT/ML injection Inject 0.2  mLs (20 Units total) into the skin daily.     losartan (COZAAR) 25 MG tablet Take 12.5 mg by mouth daily.     meloxicam (MOBIC) 15 MG tablet Take 15 mg by mouth daily.     Multiple Vitamin (MULTIVITAMIN WITH MINERALS) TABS tablet Take 1 tablet by  mouth daily.     Omega-3 Fatty Acids (FISH OIL PO) Take by mouth.     rosuvastatin  (CRESTOR ) 5 MG tablet Take 5 mg by mouth every 3 (three) days.     No current facility-administered medications for this visit.     REVIEW OF SYSTEMS:   Constitutional: Denies fevers, chills or abnormal weight loss Eyes: Denies blurriness of vision Ears, nose, mouth, throat, and face: Denies mucositis or sore throat Respiratory: Denies cough, dyspnea or wheezes Cardiovascular: Denies palpitation, chest discomfort or lower extremity swelling Gastrointestinal:  Denies nausea, heartburn or change in bowel habits Skin: Denies abnormal skin rashes Lymphatics: Denies new lymphadenopathy or easy bruising Neurological:Denies numbness, tingling or new weaknesses Behavioral/Psych: Mood is stable, no new changes  All other systems were reviewed with the patient and are negative.   VITALS:   Today's Vitals   05/30/24 0856 05/30/24 0857  BP: 128/68   Pulse: 69   Resp: 16   Temp: (!) 97.2 F (36.2 C)   TempSrc: Temporal   SpO2: 95%   Weight: 251 lb 1.6 oz (113.9 kg)   PainSc:  0-No pain   Body mass index is 37.08 kg/m.   Wt Readings from Last 3 Encounters:  05/30/24 251 lb 1.6 oz (113.9 kg)  12/01/23 255 lb 1.6 oz (115.7 kg)  06/09/23 262 lb 11.2 oz (119.2 kg)    Body mass index is 37.08 kg/m.  Performance status (ECOG): 0 - Asymptomatic  PHYSICAL EXAM:   GENERAL:alert, no distress and comfortable SKIN: skin color, texture, turgor are normal, no rashes or significant lesions EYES: normal, Conjunctiva are pink and non-injected, sclera clear OROPHARYNX:no exudate, no erythema and lips, buccal mucosa, and tongue normal  NECK: supple, thyroid normal size, non-tender, without nodularity LYMPH:  no palpable lymphadenopathy in the cervical, axillary or inguinal LUNGS: clear to auscultation and percussion with normal breathing effort HEART: regular rate & rhythm and no murmurs and no lower  extremity edema ABDOMEN:abdomen soft, non-tender and normal bowel sounds Musculoskeletal:no cyanosis of digits and no clubbing  NEURO: alert & oriented x 3 with fluent speech, no focal motor/sensory deficits  LABORATORY DATA:  I have reviewed the data as listed    Component Value Date/Time   NA 132 (L) 11/26/2020 1319   K 4.3 11/26/2020 1319   CL 95 (L) 11/26/2020 1319   CO2 27 11/26/2020 1319   GLUCOSE 300 (H) 11/26/2020 1319   BUN 20 11/26/2020 1319   CREATININE 1.15 11/26/2020 1319   CALCIUM  9.8 11/26/2020 1319   PROT 7.9 11/26/2020 1319   ALBUMIN 4.0 11/26/2020 1319   AST 24 11/26/2020 1319   ALT 39 11/26/2020 1319   ALKPHOS 133 (H) 11/26/2020 1319   BILITOT 0.8 11/26/2020 1319   GFRNONAA >60 11/26/2020 1319   GFRAA >60 11/26/2019 0023     Lab Results  Component Value Date   WBC 8.0 05/30/2024   NEUTROABS 4.7 05/30/2024   HGB 17.1 (H) 05/30/2024   HCT 51.3 05/30/2024   MCV 77.6 (L) 05/30/2024   PLT 259 05/30/2024

## 2024-05-29 NOTE — Assessment & Plan Note (Signed)
  Lab review: 11/25/2019: Hemoglobin 16.5 11/26/2020: Hemoglobin 17.6 02/11/2021: Hemoglobin 15.9, hematocrit 47 02/17/2022: Hemoglobin 18.3 with hematocrit 53.5 02/28/2022: Hemoglobin 19.5, hematocrit 57.5 (3/25: given 1 unit) 06/02/2022: Hemoglobin 16.9 (improved after stopping testosterone), sickle cell trait 11/14/2022: Hemoglobin 17.1, hematocrit 53 06/09/2023: Hemoglobin 17.6, hematocrit 53.7 12/01/2023: Hemoglobin 15.9, hematocrit 50.3 05/30/2024 - Hemoglobin 17.1 and hematocrit 51.3 - phlebotomy treatment provided    MPN panel: Negative for relevant mutations like JAK2, VUS (TET2) Erythropoietin : 21   06/19/22: U/S abd: 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  Unchanged from before.  Seeing urology   Was taking Neugenix (testosterone): He now stopped it. [facility] has stopped accepting his blood because he has sickle cell trait.  Also because his blood was too thick.   Plan: Phlebotomy for hematocrit greater than 50 (every 3 months) He will come every 3 months with lab and phlebotomy and I will see him back in 6 months

## 2024-05-30 ENCOUNTER — Encounter: Payer: Self-pay | Admitting: Nurse Practitioner

## 2024-05-30 ENCOUNTER — Inpatient Hospital Stay (HOSPITAL_BASED_OUTPATIENT_CLINIC_OR_DEPARTMENT_OTHER): Admitting: Nurse Practitioner

## 2024-05-30 ENCOUNTER — Inpatient Hospital Stay

## 2024-05-30 ENCOUNTER — Inpatient Hospital Stay: Attending: Hematology and Oncology

## 2024-05-30 VITALS — BP 119/76 | HR 60 | Resp 16

## 2024-05-30 VITALS — BP 128/68 | HR 69 | Temp 97.2°F | Resp 16 | Wt 251.1 lb

## 2024-05-30 DIAGNOSIS — D751 Secondary polycythemia: Secondary | ICD-10-CM | POA: Diagnosis not present

## 2024-05-30 LAB — CBC WITH DIFFERENTIAL (CANCER CENTER ONLY)
Abs Immature Granulocytes: 0.02 10*3/uL (ref 0.00–0.07)
Basophils Absolute: 0.1 10*3/uL (ref 0.0–0.1)
Basophils Relative: 1 %
Eosinophils Absolute: 0.3 10*3/uL (ref 0.0–0.5)
Eosinophils Relative: 4 %
HCT: 51.3 % (ref 39.0–52.0)
Hemoglobin: 17.1 g/dL — ABNORMAL HIGH (ref 13.0–17.0)
Immature Granulocytes: 0 %
Lymphocytes Relative: 25 %
Lymphs Abs: 2 10*3/uL (ref 0.7–4.0)
MCH: 25.9 pg — ABNORMAL LOW (ref 26.0–34.0)
MCHC: 33.3 g/dL (ref 30.0–36.0)
MCV: 77.6 fL — ABNORMAL LOW (ref 80.0–100.0)
Monocytes Absolute: 0.9 10*3/uL (ref 0.1–1.0)
Monocytes Relative: 11 %
Neutro Abs: 4.7 10*3/uL (ref 1.7–7.7)
Neutrophils Relative %: 59 %
Platelet Count: 259 10*3/uL (ref 150–400)
RBC: 6.61 MIL/uL — ABNORMAL HIGH (ref 4.22–5.81)
RDW: 17.6 % — ABNORMAL HIGH (ref 11.5–15.5)
WBC Count: 8 10*3/uL (ref 4.0–10.5)
nRBC: 0 % (ref 0.0–0.2)

## 2024-05-30 NOTE — Patient Instructions (Signed)

## 2024-05-30 NOTE — Progress Notes (Signed)
 [name] presents today for phlebotomy per MD orders. Phlebotomy procedure started with 16g to R AC. Started at [phone] and ended at 12. 431 grams removed. Patient observed for 10 minutes and declined to stay for 30 minutes after procedure.   Patient tolerated procedure well. IV needle removed intact. Pt VSS and ambulated to lobby in stable condition.

## 2024-05-31 ENCOUNTER — Telehealth: Payer: Self-pay | Admitting: Hematology and Oncology

## 2024-05-31 ENCOUNTER — Inpatient Hospital Stay: Payer: BC Managed Care – PPO | Admitting: Hematology and Oncology

## 2024-05-31 ENCOUNTER — Inpatient Hospital Stay: Payer: BC Managed Care – PPO

## 2024-05-31 NOTE — Telephone Encounter (Signed)
 Scheduled appointments per 6/16 los. Talked with the patient and he is aware of the made appointments.

## 2024-06-01 DIAGNOSIS — K080 Exfoliation of teeth due to systemic causes: Secondary | ICD-10-CM | POA: Diagnosis not present

## 2024-06-27 DIAGNOSIS — E1165 Type 2 diabetes mellitus with hyperglycemia: Secondary | ICD-10-CM | POA: Diagnosis not present

## 2024-07-18 DIAGNOSIS — E291 Testicular hypofunction: Secondary | ICD-10-CM | POA: Diagnosis not present

## 2024-07-18 DIAGNOSIS — E1165 Type 2 diabetes mellitus with hyperglycemia: Secondary | ICD-10-CM | POA: Diagnosis not present

## 2024-07-27 DIAGNOSIS — E785 Hyperlipidemia, unspecified: Secondary | ICD-10-CM | POA: Diagnosis not present

## 2024-07-27 DIAGNOSIS — E1165 Type 2 diabetes mellitus with hyperglycemia: Secondary | ICD-10-CM | POA: Diagnosis not present

## 2024-07-27 DIAGNOSIS — N1831 Chronic kidney disease, stage 3a: Secondary | ICD-10-CM | POA: Diagnosis not present

## 2024-07-27 DIAGNOSIS — I10 Essential (primary) hypertension: Secondary | ICD-10-CM | POA: Diagnosis not present

## 2024-07-27 DIAGNOSIS — E139 Other specified diabetes mellitus without complications: Secondary | ICD-10-CM | POA: Diagnosis not present

## 2024-07-28 DIAGNOSIS — E1165 Type 2 diabetes mellitus with hyperglycemia: Secondary | ICD-10-CM | POA: Diagnosis not present

## 2024-08-15 DIAGNOSIS — E1165 Type 2 diabetes mellitus with hyperglycemia: Secondary | ICD-10-CM | POA: Diagnosis not present

## 2024-08-30 ENCOUNTER — Inpatient Hospital Stay

## 2024-10-28 DIAGNOSIS — E1165 Type 2 diabetes mellitus with hyperglycemia: Secondary | ICD-10-CM | POA: Diagnosis not present

## 2024-11-29 ENCOUNTER — Inpatient Hospital Stay

## 2024-11-29 ENCOUNTER — Inpatient Hospital Stay: Admitting: Hematology and Oncology

## 2024-11-30 ENCOUNTER — Ambulatory Visit

## 2024-11-30 ENCOUNTER — Other Ambulatory Visit: Payer: Self-pay | Admitting: *Deleted

## 2024-11-30 ENCOUNTER — Inpatient Hospital Stay: Attending: Hematology and Oncology

## 2024-11-30 ENCOUNTER — Ambulatory Visit: Admitting: Hematology and Oncology

## 2024-11-30 VITALS — BP 144/82 | HR 93 | Temp 97.8°F | Resp 18 | Ht 69.0 in | Wt 254.2 lb

## 2024-11-30 DIAGNOSIS — D751 Secondary polycythemia: Secondary | ICD-10-CM

## 2024-11-30 DIAGNOSIS — Z791 Long term (current) use of non-steroidal anti-inflammatories (NSAID): Secondary | ICD-10-CM | POA: Insufficient documentation

## 2024-11-30 DIAGNOSIS — D573 Sickle-cell trait: Secondary | ICD-10-CM | POA: Insufficient documentation

## 2024-11-30 DIAGNOSIS — N289 Disorder of kidney and ureter, unspecified: Secondary | ICD-10-CM | POA: Insufficient documentation

## 2024-11-30 DIAGNOSIS — Z888 Allergy status to other drugs, medicaments and biological substances status: Secondary | ICD-10-CM | POA: Insufficient documentation

## 2024-11-30 DIAGNOSIS — Z79899 Other long term (current) drug therapy: Secondary | ICD-10-CM | POA: Insufficient documentation

## 2024-11-30 LAB — CBC WITH DIFFERENTIAL (CANCER CENTER ONLY)
Abs Immature Granulocytes: 0.07 K/uL (ref 0.00–0.07)
Basophils Absolute: 0 K/uL (ref 0.0–0.1)
Basophils Relative: 1 %
Eosinophils Absolute: 0.3 K/uL (ref 0.0–0.5)
Eosinophils Relative: 4 %
HCT: 49.6 % (ref 39.0–52.0)
Hemoglobin: 16.1 g/dL (ref 13.0–17.0)
Immature Granulocytes: 1 %
Lymphocytes Relative: 27 %
Lymphs Abs: 2 K/uL (ref 0.7–4.0)
MCH: 25.3 pg — ABNORMAL LOW (ref 26.0–34.0)
MCHC: 32.5 g/dL (ref 30.0–36.0)
MCV: 77.9 fL — ABNORMAL LOW (ref 80.0–100.0)
Monocytes Absolute: 0.7 K/uL (ref 0.1–1.0)
Monocytes Relative: 10 %
Neutro Abs: 4.1 K/uL (ref 1.7–7.7)
Neutrophils Relative %: 57 %
Platelet Count: 289 K/uL (ref 150–400)
RBC: 6.37 MIL/uL — ABNORMAL HIGH (ref 4.22–5.81)
RDW: 16.9 % — ABNORMAL HIGH (ref 11.5–15.5)
WBC Count: 7.2 K/uL (ref 4.0–10.5)
nRBC: 0 % (ref 0.0–0.2)

## 2024-11-30 NOTE — Assessment & Plan Note (Deleted)
 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  Unchanged from before.  Seeing urology   Was taking Neugenix (testosterone ): He now stopped it. [facility] has stopped accepting his blood because he has sickle cell trait.  Also because his blood was too thick.   Plan: Phlebotomy for hematocrit greater than 50 (every 3 months) He will come every 3 months with lab and phlebotomy and I will see him back in 6 months

## 2024-11-30 NOTE — Progress Notes (Signed)
 [name] presents today for phlebotomy per MD orders. Phlebotomy procedure started with 16g to R AC. Started at 0930 am and ended at 66. 482 grams was  removed. Patient observed for 10 minutes and declined to stay for 30 minutes after procedure. Patient tolerated procedure well. IV needle removed intact. Pt VSS and ambulatory to lobby in stable condition.

## 2024-11-30 NOTE — Progress Notes (Signed)
 Patient Care Team: Verena Mems, MD as PCP - General (Family Medicine)  DIAGNOSIS:  Encounter Diagnosis  Name Primary?   Polycythemia, secondary Yes   CHIEF COMPLIANT: Follow-up of polycythemia  HISTORY OF PRESENT ILLNESS:   History of Present Illness [name] is a 68 year old male with secondary polycythemia who presents for routine hematology follow-up and laboratory monitoring.  He missed his previous three-month follow-up but has no current symptoms, including headache, edema, dyspnea, abnormal bleeding, bruising, or new masses.  Recent labs show hemoglobin 16.1 g/dL with normal leukocyte and platelet counts. Hematocrit is lower than prior elevated values and is stable.  Dec 01, 2023: Follow-up for secondary polycythemia; hemoglobin 15.9, hematocrit 50.3. Continues routine phlebotomy every three months, tolerating well. MPN panel negative, erythropoietin  21, sickle cell trait noted, and right renal lesion stable on recent imaging. Plan for continued phlebotomy if hematocrit >50 and follow-up in six months.     ALLERGIES:  is allergic to amoxicill-clarithro-lansopraz, fenofibrate, lisinopril, metformin, and other.  MEDICATIONS:  Current Outpatient Medications  Medication Sig Dispense Refill   allopurinol  (ZYLOPRIM ) 300 MG tablet Take 150-300 mg by mouth daily before breakfast.      atenolol -chlorthalidone  (TENORETIC ) 100-25 MG tablet Take 0.5 tablets by mouth daily.     cetirizine (ZYRTEC) 10 MG tablet Take 10 mg by mouth daily.     esomeprazole (NEXIUM) 40 MG capsule Take 40 mg by mouth daily.     insulin  aspart (NOVOLOG  FLEXPEN) 100 UNIT/ML FlexPen Inject 2-10 Units into the skin 3 (three) times daily with meals.     insulin  glargine (LANTUS ) 100 UNIT/ML injection Inject 0.2 mLs (20 Units total) into the skin daily.     losartan (COZAAR) 25 MG tablet Take 12.5 mg by mouth daily.     meloxicam (MOBIC) 15 MG tablet Take 15 mg by mouth daily.     Multiple Vitamin  (MULTIVITAMIN WITH MINERALS) TABS tablet Take 1 tablet by mouth daily.     Omega-3 Fatty Acids (FISH OIL PO) Take by mouth.     rosuvastatin  (CRESTOR ) 5 MG tablet Take 5 mg by mouth every 3 (three) days.     No current facility-administered medications for this visit.    PHYSICAL EXAMINATION: ECOG PERFORMANCE STATUS: 1 - Symptomatic but completely ambulatory  Vitals:   11/30/24 0835  BP: (!) 144/82  Pulse: 93  Resp: 18  Temp: 97.8 F (36.6 C)  SpO2: 96%   Filed Weights   11/30/24 0835  Weight: 254 lb 3.2 oz (115.3 kg)    LABORATORY DATA:  I have reviewed the data as listed    Latest Ref Rng & Units 11/26/2020    1:19 PM 11/26/2019   12:23 AM 11/25/2019    2:10 AM  CMP  Glucose 70 - 99 mg/dL 699  888  96   BUN 8 - 23 mg/dL 20  35  37   Creatinine 0.61 - 1.24 mg/dL 8.84  8.90  8.86   Sodium 135 - 145 mmol/L 132  137  136   Potassium 3.5 - 5.1 mmol/L 4.3  3.8  3.1   Chloride 98 - 111 mmol/L 95  99  95   CO2 22 - 32 mmol/L 27  26  27    Calcium  8.9 - 10.3 mg/dL 9.8  9.1  8.8   Total Protein 6.5 - 8.1 g/dL 7.9  7.3  7.5   Total Bilirubin 0.3 - 1.2 mg/dL 0.8  1.3  1.3   Alkaline Phos 38 - 126  U/L 133  80  89   AST 15 - 41 U/L 24  35  52   ALT 0 - 44 U/L 39  55  61     Lab Results  Component Value Date   WBC 7.2 11/30/2024   HGB 16.1 11/30/2024   HCT 49.6 11/30/2024   MCV 77.9 (L) 11/30/2024   PLT 289 11/30/2024   NEUTROABS 4.1 11/30/2024    ASSESSMENT & PLAN:  Polycythemia, secondary Lab review: 11/25/2019: Hemoglobin 16.5 11/26/2020: Hemoglobin 17.6 02/11/2021: Hemoglobin 15.9, hematocrit 47 02/17/2022: Hemoglobin 18.3 with hematocrit 53.5 02/28/2022: Hemoglobin 19.5, hematocrit 57.5 (3/25: given 1 unit) 06/02/2022: Hemoglobin 16.9 (improved after stopping testosterone ), sickle cell trait 11/14/2022: Hemoglobin 17.1, hematocrit 53 06/09/2023: Hemoglobin 17.6, hematocrit 53.7 12/01/2023: Hemoglobin 15.9, hematocrit 50.3 05/30/2024: Hemoglobin 17.1, hematocrit  51.3 11/30/2024: Hemoglobin 16.1, hematocrit 49.6   MPN panel: Negative for relevant mutations like JAK2, VUS (TET2) Erythropoietin : 21   06/19/22: U/S abd: 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  Unchanged from before.  Seeing urology   Was taking Neugenix (testosterone ): He now stopped it. [facility] has stopped accepting his blood because he has sickle cell trait.  Also because his blood was too thick.   Plan: Phlebotomy for hematocrit greater than 45 (changing to every 6 months)       No orders of the defined types were placed in this encounter.  The patient has a good understanding of the overall plan. he agrees with it. he will call with any problems that may develop before the next visit here.  I personally spent a total of 30 minutes in the care of the patient today including preparing to see the patient, getting/reviewing separately obtained history, performing a medically appropriate exam/evaluation, counseling and educating, placing orders, referring and communicating with other health care professionals, documenting clinical information in the EHR, independently interpreting results, communicating results, and coordinating care.   Viinay K Laurieanne Galloway, MD 11/30/2024

## 2024-11-30 NOTE — Patient Instructions (Signed)

## 2025-01-04 NOTE — Progress Notes (Signed)
 [name]                                          MRN: [id number]   01/04/2025   The [facility] Quality Team Specialist reviewed this patient medical record for the purposes of chart review for care gap closure. The following were reviewed: chart review for care gap closure-kidney health evaluation for diabetes:eGFR  and uACR.    [facility] Quality Team

## 2025-05-31 ENCOUNTER — Inpatient Hospital Stay
